# Patient Record
Sex: Female | Born: 1945 | Race: Black or African American | Hispanic: No | Marital: Single | Smoking: Never smoker
Health system: Southern US, Community
[De-identification: ages and names within clinical notes are randomized; demographics above are authoritative.]

## PROBLEM LIST (undated history)

## (undated) DIAGNOSIS — J40 Bronchitis, not specified as acute or chronic: Secondary | ICD-10-CM

## (undated) DIAGNOSIS — E119 Type 2 diabetes mellitus without complications: Secondary | ICD-10-CM

## (undated) DIAGNOSIS — I10 Essential (primary) hypertension: Secondary | ICD-10-CM

## (undated) HISTORY — PX: VESICOVAGINAL FISTULA CLOSURE: SUR270

## (undated) HISTORY — PX: TONSILECTOMY/ADENOIDECTOMY WITH MYRINGOTOMY: SHX6125

## (undated) HISTORY — DX: Type 2 diabetes mellitus without complications: E11.9

## (undated) HISTORY — PX: ABDOMINAL HYSTERECTOMY: SHX81

---

## 2005-01-10 HISTORY — PX: BREAST BIOPSY: SHX20

## 2012-06-16 DIAGNOSIS — M6740 Ganglion, unspecified site: Secondary | ICD-10-CM | POA: Insufficient documentation

## 2012-06-16 DIAGNOSIS — K644 Residual hemorrhoidal skin tags: Secondary | ICD-10-CM | POA: Insufficient documentation

## 2012-06-16 DIAGNOSIS — F4321 Adjustment disorder with depressed mood: Secondary | ICD-10-CM | POA: Insufficient documentation

## 2012-06-16 HISTORY — DX: Adjustment disorder with depressed mood: F43.21

## 2012-06-28 DIAGNOSIS — N63 Unspecified lump in unspecified breast: Secondary | ICD-10-CM | POA: Insufficient documentation

## 2012-07-24 DIAGNOSIS — N644 Mastodynia: Secondary | ICD-10-CM | POA: Insufficient documentation

## 2013-04-06 DIAGNOSIS — J309 Allergic rhinitis, unspecified: Secondary | ICD-10-CM | POA: Insufficient documentation

## 2013-04-06 DIAGNOSIS — E785 Hyperlipidemia, unspecified: Secondary | ICD-10-CM | POA: Insufficient documentation

## 2013-04-06 HISTORY — DX: Hyperlipidemia, unspecified: E78.5

## 2013-09-12 DIAGNOSIS — IMO0002 Reserved for concepts with insufficient information to code with codable children: Secondary | ICD-10-CM | POA: Insufficient documentation

## 2013-09-12 DIAGNOSIS — N816 Rectocele: Secondary | ICD-10-CM | POA: Insufficient documentation

## 2013-09-17 DIAGNOSIS — K219 Gastro-esophageal reflux disease without esophagitis: Secondary | ICD-10-CM | POA: Insufficient documentation

## 2013-09-17 HISTORY — DX: Gastro-esophageal reflux disease without esophagitis: K21.9

## 2016-01-11 HISTORY — PX: LAPAROSCOPIC LEFT COLON RESECTION: SHX1928

## 2016-05-29 ENCOUNTER — Emergency Department (HOSPITAL_BASED_OUTPATIENT_CLINIC_OR_DEPARTMENT_OTHER): Payer: Medicare Other

## 2016-05-29 ENCOUNTER — Emergency Department (HOSPITAL_BASED_OUTPATIENT_CLINIC_OR_DEPARTMENT_OTHER)
Admission: EM | Admit: 2016-05-29 | Discharge: 2016-05-29 | Disposition: A | Discharge status: Home / Self Care | Payer: Medicare Other | Attending: Emergency Medicine | Admitting: Emergency Medicine

## 2016-05-29 ENCOUNTER — Encounter (HOSPITAL_BASED_OUTPATIENT_CLINIC_OR_DEPARTMENT_OTHER): Payer: Self-pay | Admitting: Emergency Medicine

## 2016-05-29 DIAGNOSIS — I10 Essential (primary) hypertension: Secondary | ICD-10-CM | POA: Diagnosis not present

## 2016-05-29 DIAGNOSIS — R05 Cough: Secondary | ICD-10-CM | POA: Insufficient documentation

## 2016-05-29 DIAGNOSIS — R059 Cough, unspecified: Secondary | ICD-10-CM

## 2016-05-29 DIAGNOSIS — R072 Precordial pain: Secondary | ICD-10-CM | POA: Insufficient documentation

## 2016-05-29 HISTORY — DX: Bronchitis, not specified as acute or chronic: J40

## 2016-05-29 HISTORY — DX: Essential (primary) hypertension: I10

## 2016-05-29 LAB — CBC WITH DIFFERENTIAL/PLATELET
BASOS ABS: 0 10*3/uL (ref 0.0–0.1)
BASOS PCT: 0 %
Eosinophils Absolute: 0.1 10*3/uL (ref 0.0–0.7)
Eosinophils Relative: 1 %
HEMATOCRIT: 44.3 % (ref 36.0–46.0)
Hemoglobin: 15.4 g/dL — ABNORMAL HIGH (ref 12.0–15.0)
LYMPHS PCT: 16 %
Lymphs Abs: 1.5 10*3/uL (ref 0.7–4.0)
MCH: 29.5 pg (ref 26.0–34.0)
MCHC: 34.8 g/dL (ref 30.0–36.0)
MCV: 84.9 fL (ref 78.0–100.0)
MONO ABS: 1.4 10*3/uL — AB (ref 0.1–1.0)
Monocytes Relative: 14 %
NEUTROS ABS: 6.8 10*3/uL (ref 1.7–7.7)
Neutrophils Relative %: 69 %
PLATELETS: 256 10*3/uL (ref 150–400)
RBC: 5.22 MIL/uL — AB (ref 3.87–5.11)
RDW: 15 % (ref 11.5–15.5)
WBC: 9.8 10*3/uL (ref 4.0–10.5)

## 2016-05-29 LAB — BASIC METABOLIC PANEL
Anion gap: 8 (ref 5–15)
BUN: 19 mg/dL (ref 6–20)
CHLORIDE: 101 mmol/L (ref 101–111)
CO2: 28 mmol/L (ref 22–32)
Calcium: 10.7 mg/dL — ABNORMAL HIGH (ref 8.9–10.3)
Creatinine, Ser: 0.85 mg/dL (ref 0.44–1.00)
GFR calc non Af Amer: >60 mL/min (ref >60)
Glucose, Bld: 119 mg/dL — ABNORMAL HIGH (ref 65–99)
Potassium: 3.8 mmol/L (ref 3.5–5.1)
Sodium: 137 mmol/L (ref 135–145)

## 2016-05-29 LAB — TROPONIN I
TROPONIN I: 0.03 ng/mL — AB (ref <0.03)
TROPONIN I: 0.03 ng/mL — AB (ref <0.03)

## 2016-05-29 MED ORDER — KETOROLAC TROMETHAMINE 15 MG/ML IJ SOLN
15.0000 mg | Freq: Once | INTRAMUSCULAR | Status: AC
  Administered 2016-05-29: 15 mg via INTRAVENOUS
  Filled 2016-05-29: qty 1

## 2016-05-29 MED ORDER — FLUTICASONE PROPIONATE 50 MCG/ACT NA SUSP: 2.0000 | Freq: Every day | NASAL | 2 refills | Status: AC

## 2016-05-29 MED ORDER — ALBUTEROL SULFATE HFA 108 (90 BASE) MCG/ACT IN AERS
2.0000 | INHALATION_SPRAY | Freq: Once | RESPIRATORY_TRACT | Status: AC
  Administered 2016-05-29: 2 via RESPIRATORY_TRACT
  Filled 2016-05-29: qty 6.7

## 2016-05-29 MED ORDER — BENZONATATE 100 MG PO CAPS: 100.0000 mg | ORAL_CAPSULE | Freq: Three times a day (TID) | ORAL | 0 refills | Status: AC

## 2016-05-29 NOTE — Discharge Instructions (Signed)

## 2016-05-29 NOTE — ED Provider Notes (Signed)
Emergency Department Provider Note   I have reviewed the triage vital signs and the nursing notes.   HISTORY  Chief Complaint Cough   HPI Miranda [name] is a 71 y.o. female with PMH of HTN presents to the emergency department for evaluation of worsening cough and chest tightness with some shortness of breath over the last 3 days. She has had body aches and some subjective fevers. Cough is minimally productive. Does not smoke cigarettes. No abdominal pain, nausea, vomiting, diarrhea. She reports history of seasonal allergy irritation but this feels more severe to her. She describes a central chest tightness that is mostly with coughing. No active chest tights unless with coughing.    Past Medical History:  Diagnosis Date  . Bronchitis   . Hypertension     There are no active problems to display for this patient.   Past Surgical History:  Procedure Laterality Date  . VESICOVAGINAL FISTULA CLOSURE      Current Outpatient Rx  . Order #: [id number] Class: Historical Med  . Order #: [id number] Class: Historical Med  . Order #: [id number] Class: Historical Med  . Order #: [id number] Class: Print  . Order #: [id number] Class: Print    Allergies Patient has no known allergies.  No family history on file.  Social History Social History  Substance Use Topics  . Smoking status: Never Smoker  . Smokeless tobacco: Never Used  . Alcohol use No    Review of Systems  Constitutional: No fever/chills Eyes: No visual changes. ENT: No sore throat. Cardiovascular: Positive chest pain with coughing.  Respiratory: Denies shortness of breath. Positive cough.  Gastrointestinal: No abdominal pain.  No nausea, no vomiting.  No diarrhea.  No constipation. Genitourinary: Negative for dysuria. Musculoskeletal: Negative for back pain. Skin: Negative for rash. Neurological: Negative for headaches, focal weakness or numbness.  10-point ROS otherwise  negative.  ____________________________________________   PHYSICAL EXAM:  VITAL SIGNS: ED Triage Vitals  Enc Vitals Group     BP 05/29/16 0945 140/88     Pulse Rate 05/29/16 0945 67     Resp 05/29/16 0945 18     Temp 05/29/16 0945 99.9 F (37.7 C)     Temp Source 05/29/16 0945 Oral     SpO2 05/29/16 0945 97 %     Weight 05/29/16 0945 157 lb (71.2 kg)     Height 05/29/16 0945 5\' 4"  (1.626 m)     Pain Score 05/29/16 0950 4   Constitutional: Alert and oriented. Well appearing and in no acute distress. Eyes: Conjunctivae are normal. Head: Atraumatic. Nose: No congestion/rhinnorhea. Mouth/Throat: Mucous membranes are moist.  Oropharynx non-erythematous. Neck: No stridor.  Cardiovascular: Normal rate, regular rhythm. Good peripheral circulation. Grossly normal heart sounds.   Respiratory: Normal respiratory effort.  No retractions. Lungs CTAB. Gastrointestinal: Soft and nontender. No distention.  Musculoskeletal: No lower extremity tenderness nor edema. No gross deformities of extremities. Neurologic:  Normal speech and language. No gross focal neurologic deficits are appreciated.  Skin:  Skin is warm, dry and intact. No rash noted.   ____________________________________________   LABS (all labs ordered are listed, but only abnormal results are displayed)  Labs Reviewed  TROPONIN I - Abnormal; Notable for the following:       Result Value   Troponin I 0.03 (*)    All other components within normal limits  CBC WITH DIFFERENTIAL/PLATELET - Abnormal; Notable for the following:    RBC 5.22 (*)    Hemoglobin 15.4 (*)    Monocytes Absolute 1.4 (*)  All other components within normal limits  BASIC METABOLIC PANEL - Abnormal; Notable for the following:    Glucose, Bld 119 (*)    Calcium 10.7 (*)    All other components within normal limits  TROPONIN I - Abnormal; Notable for the following:    Troponin I 0.03 (*)    All other components within normal limits    ____________________________________________  EKG   EKG Interpretation  Date/Time:  Sunday May 29 2016 10:38:17 EDT Ventricular Rate:  78 PR Interval:    QRS Duration: 83 QT Interval:  488 QTC Calculation: 556 R Axis:   13 Text Interpretation:  Sinus rhythm Borderline T wave abnormalities Prolonged QT interval No STEMI. No old tracing for comparison.  Confirmed by Nanda Quinton [id number]) on 05/30/2016 9:52:38 AM       ____________________________________________  RADIOLOGY  Dg Chest 2 View  Result Date: 05/29/2016 CLINICAL DATA:  Cough, congestion, yellow phlegm, symptoms x 3 days, unsure of fever Takes albulterol as needed for seasonal allergies EXAM: CHEST - 2 VIEW COMPARISON:  none FINDINGS: Minimal subsegmental atelectasis, infiltrate or scarring in both lung bases. Lungs otherwise clear. Heart size and mediastinal contours are within normal limits. No pneumothorax or effusion. Visualized bones unremarkable. IMPRESSION: Minimal bibasilar subsegmental atelectasis, infiltrate or scarring. Electronically Signed   By: Lucrezia Europe M.D.   On: 05/29/2016 10:22    ____________________________________________   PROCEDURES  Procedure(s) performed:   Procedures  None ____________________________________________   INITIAL IMPRESSION / ASSESSMENT AND PLAN / ED COURSE  Pertinent labs & imaging results that were available during my care of the patient were reviewed by me and considered in my medical decision making (see chart for details).  Patient resents to the emergency department for evaluation of difficulty breathing, cough, and chest tightness. Symptoms seem most consistent with a viral process. Plan for chest x-ray to rule out pneumonia. Will obtain labs for ACS r/o. With 3 days of symptoms there is no need for trending.   Troponin is 0.03 which is top end of normal for test. Patient feeling better after IVF and low-dose Toradol. Plan for repeat troponin to ensure no rising  value. Explained test result to patient and husband along with my plan. Agreeable to stay.   Repeat troponin unchanged. Plan for discharge with cough medication and plan for conservative mgmt at home and PCP f/u. Discussed return precautions for persistent chest pressure without cough or symptoms of other anginal equivalents.   At this time, I do not feel there is any life-threatening condition present. I have reviewed and discussed all results (EKG, imaging, lab, urine as appropriate), exam findings with patient. I have reviewed nursing notes and appropriate previous records.  I feel the patient is safe to be discharged home without further emergent workup. Discussed usual and customary return precautions. Patient and family (if present) verbalize understanding and are comfortable with this plan.  Patient will follow-up with their primary care provider. If they do not have a primary care provider, information for follow-up has been provided to them. All questions have been answered.  ____________________________________________  FINAL CLINICAL IMPRESSION(S) / ED DIAGNOSES  Final diagnoses:  Cough  Precordial chest pain     MEDICATIONS GIVEN DURING THIS VISIT:  Medications  albuterol (PROVENTIL HFA;VENTOLIN HFA) 108 (90 Base) MCG/ACT inhaler 2 puff (2 puffs Inhalation Given 05/29/16 1033)  ketorolac (TORADOL) 15 MG/ML injection 15 mg (15 mg Intravenous Given 05/29/16 1339)     NEW OUTPATIENT MEDICATIONS STARTED DURING THIS VISIT:  Discharge Medication List as of 05/29/2016  2:34 PM    START taking these medications   Details  benzonatate (TESSALON) 100 MG capsule Take 1 capsule (100 mg total) by mouth every 8 (eight) hours., Starting Sun 05/29/2016, Print    fluticasone (FLONASE) 50 MCG/ACT nasal spray Place 2 sprays into both nostrils daily., Starting Sun 05/29/2016, Print        Note:  This document was prepared using Dragon voice recognition software and may include unintentional  dictation errors.  Nanda Quinton, MD Emergency Medicine   Daveda Larock, [facility], MD 05/30/16 [phone]

## 2016-05-29 NOTE — ED Notes (Signed)
Pt ambulatory to BR without assistance, in NAD.

## 2016-05-29 NOTE — ED Notes (Signed)
Given juice

## 2016-05-29 NOTE — ED Triage Notes (Signed)
Since Thursday has been aching all over, cough, yellow mucus and nasal congestion. Was exposed to a family member that was coughing

## 2016-05-29 NOTE — ED Notes (Signed)
Date and time results received: 05/29/16  1155 (use smartphrase ".now" to insert current time)  Test: troponin Critical Value: 0.03  Name of Provider Notified: Sophie, RN  Orders Received? Or Actions Taken?: No orders received

## 2016-06-21 DIAGNOSIS — H35372 Puckering of macula, left eye: Secondary | ICD-10-CM | POA: Insufficient documentation

## 2016-06-21 DIAGNOSIS — H04123 Dry eye syndrome of bilateral lacrimal glands: Secondary | ICD-10-CM | POA: Insufficient documentation

## 2016-06-21 DIAGNOSIS — H2513 Age-related nuclear cataract, bilateral: Secondary | ICD-10-CM | POA: Insufficient documentation

## 2016-10-16 DIAGNOSIS — C184 Malignant neoplasm of transverse colon: Secondary | ICD-10-CM | POA: Insufficient documentation

## 2016-10-16 HISTORY — DX: Malignant neoplasm of transverse colon: C18.4

## 2016-11-24 DIAGNOSIS — I10 Essential (primary) hypertension: Secondary | ICD-10-CM

## 2016-11-24 HISTORY — DX: Essential (primary) hypertension: I10

## 2017-01-12 DIAGNOSIS — H5213 Myopia, bilateral: Secondary | ICD-10-CM | POA: Insufficient documentation

## 2017-01-12 DIAGNOSIS — H43813 Vitreous degeneration, bilateral: Secondary | ICD-10-CM | POA: Insufficient documentation

## 2018-12-10 DIAGNOSIS — U071 COVID-19: Secondary | ICD-10-CM | POA: Insufficient documentation

## 2019-04-02 DIAGNOSIS — E559 Vitamin D deficiency, unspecified: Secondary | ICD-10-CM | POA: Insufficient documentation

## 2019-04-02 DIAGNOSIS — E213 Hyperparathyroidism, unspecified: Secondary | ICD-10-CM | POA: Insufficient documentation

## 2019-08-29 ENCOUNTER — Encounter: Payer: Self-pay | Admitting: Surgery

## 2019-08-29 ENCOUNTER — Emergency Department (HOSPITAL_BASED_OUTPATIENT_CLINIC_OR_DEPARTMENT_OTHER): Payer: Medicare Other

## 2019-08-29 ENCOUNTER — Ambulatory Visit: Payer: Self-pay | Admitting: Surgery

## 2019-08-29 ENCOUNTER — Emergency Department (HOSPITAL_BASED_OUTPATIENT_CLINIC_OR_DEPARTMENT_OTHER)
Admission: EM | Admit: 2019-08-29 | Discharge: 2019-08-29 | Disposition: A | Discharge status: Home / Self Care | Payer: Medicare Other | Attending: Emergency Medicine | Admitting: Emergency Medicine

## 2019-08-29 ENCOUNTER — Other Ambulatory Visit: Payer: Self-pay

## 2019-08-29 ENCOUNTER — Encounter (HOSPITAL_BASED_OUTPATIENT_CLINIC_OR_DEPARTMENT_OTHER): Payer: Self-pay

## 2019-08-29 DIAGNOSIS — Z20822 Contact with and (suspected) exposure to covid-19: Secondary | ICD-10-CM | POA: Diagnosis not present

## 2019-08-29 DIAGNOSIS — K8689 Other specified diseases of pancreas: Secondary | ICD-10-CM | POA: Diagnosis not present

## 2019-08-29 DIAGNOSIS — Z79899 Other long term (current) drug therapy: Secondary | ICD-10-CM | POA: Diagnosis not present

## 2019-08-29 DIAGNOSIS — Z7982 Long term (current) use of aspirin: Secondary | ICD-10-CM | POA: Insufficient documentation

## 2019-08-29 DIAGNOSIS — U071 COVID-19: Secondary | ICD-10-CM

## 2019-08-29 DIAGNOSIS — K430 Incisional hernia with obstruction, without gangrene: Secondary | ICD-10-CM | POA: Insufficient documentation

## 2019-08-29 DIAGNOSIS — I10 Essential (primary) hypertension: Secondary | ICD-10-CM | POA: Diagnosis not present

## 2019-08-29 DIAGNOSIS — K56609 Unspecified intestinal obstruction, unspecified as to partial versus complete obstruction: Secondary | ICD-10-CM | POA: Insufficient documentation

## 2019-08-29 DIAGNOSIS — R1084 Generalized abdominal pain: Secondary | ICD-10-CM | POA: Diagnosis present

## 2019-08-29 DIAGNOSIS — E119 Type 2 diabetes mellitus without complications: Secondary | ICD-10-CM | POA: Insufficient documentation

## 2019-08-29 LAB — COMPREHENSIVE METABOLIC PANEL
ALT: 23 U/L (ref 0–44)
AST: 21 U/L (ref 15–41)
Albumin: 4.2 g/dL (ref 3.5–5.0)
Alkaline Phosphatase: 87 U/L (ref 38–126)
Anion gap: 12 (ref 5–15)
BUN: 17 mg/dL (ref 8–23)
CO2: 21 mmol/L — ABNORMAL LOW (ref 22–32)
Calcium: 10.7 mg/dL — ABNORMAL HIGH (ref 8.9–10.3)
Chloride: 104 mmol/L (ref 98–111)
Creatinine, Ser: 0.87 mg/dL (ref 0.44–1.00)
GFR calc Af Amer: >60 mL/min (ref >60)
GFR calc non Af Amer: >60 mL/min (ref >60)
Glucose, Bld: 147 mg/dL — ABNORMAL HIGH (ref 70–99)
Potassium: 3.9 mmol/L (ref 3.5–5.1)
Sodium: 137 mmol/L (ref 135–145)
Total Bilirubin: 0.7 mg/dL (ref 0.3–1.2)
Total Protein: 9 g/dL — ABNORMAL HIGH (ref 6.5–8.1)

## 2019-08-29 LAB — CBC WITH DIFFERENTIAL/PLATELET
Abs Immature Granulocytes: 0.04 10*3/uL (ref 0.00–0.07)
Basophils Absolute: 0 10*3/uL (ref 0.0–0.1)
Basophils Relative: 0 %
Eosinophils Absolute: 0 10*3/uL (ref 0.0–0.5)
Eosinophils Relative: 0 %
HCT: 50 % — ABNORMAL HIGH (ref 36.0–46.0)
Hemoglobin: 16.8 g/dL — ABNORMAL HIGH (ref 12.0–15.0)
Immature Granulocytes: 0 %
Lymphocytes Relative: 11 %
Lymphs Abs: 1.1 10*3/uL (ref 0.7–4.0)
MCH: 28.6 pg (ref 26.0–34.0)
MCHC: 33.6 g/dL (ref 30.0–36.0)
MCV: 85.2 fL (ref 80.0–100.0)
Monocytes Absolute: 0.4 10*3/uL (ref 0.1–1.0)
Monocytes Relative: 4 %
Neutro Abs: 8.3 10*3/uL — ABNORMAL HIGH (ref 1.7–7.7)
Neutrophils Relative %: 85 %
Platelets: 300 10*3/uL (ref 150–400)
RBC: 5.87 MIL/uL — ABNORMAL HIGH (ref 3.87–5.11)
RDW: 14.5 % (ref 11.5–15.5)
WBC: 9.9 10*3/uL (ref 4.0–10.5)
nRBC: 0 % (ref 0.0–0.2)

## 2019-08-29 LAB — URINALYSIS, ROUTINE W REFLEX MICROSCOPIC
Bilirubin Urine: NEGATIVE
Glucose, UA: 250 mg/dL — AB
Hgb urine dipstick: NEGATIVE
Ketones, ur: NEGATIVE mg/dL
Leukocytes,Ua: NEGATIVE
Nitrite: NEGATIVE
Protein, ur: 100 mg/dL — AB
Specific Gravity, Urine: 1.025 (ref 1.005–1.030)
pH: 6 (ref 5.0–8.0)

## 2019-08-29 LAB — SARS CORONAVIRUS 2 BY RT PCR (HOSPITAL ORDER, PERFORMED IN ~~LOC~~ HOSPITAL LAB): SARS Coronavirus 2: NEGATIVE

## 2019-08-29 LAB — URINALYSIS, MICROSCOPIC (REFLEX)

## 2019-08-29 LAB — LIPASE, BLOOD: Lipase: 37 U/L (ref 11–51)

## 2019-08-29 MED ORDER — IOHEXOL 300 MG/ML  SOLN
100.0000 mL | Freq: Once | INTRAMUSCULAR | Status: AC | PRN
  Administered 2019-08-29: 100 mL via INTRAVENOUS

## 2019-08-29 MED ORDER — FENTANYL CITRATE (PF) 100 MCG/2ML IJ SOLN
50.0000 ug | Freq: Once | INTRAMUSCULAR | Status: AC | PRN
  Administered 2019-08-29: 50 ug via INTRAVENOUS
  Filled 2019-08-29: qty 2

## 2019-08-29 MED ORDER — FENTANYL CITRATE (PF) 100 MCG/2ML IJ SOLN
50.0000 ug | Freq: Once | INTRAMUSCULAR | Status: AC
  Administered 2019-08-29: 50 ug via INTRAVENOUS
  Filled 2019-08-29: qty 2

## 2019-08-29 MED ORDER — ONDANSETRON HCL 4 MG/2ML IJ SOLN
4.0000 mg | Freq: Once | INTRAMUSCULAR | Status: DC
  Filled 2019-08-29: qty 2

## 2019-08-29 MED ORDER — MORPHINE SULFATE (PF) 2 MG/ML IV SOLN
2.0000 mg | Freq: Once | INTRAVENOUS | Status: DC
  Filled 2019-08-29: qty 1

## 2019-08-29 MED ORDER — ONDANSETRON HCL 4 MG/2ML IJ SOLN
4.0000 mg | Freq: Once | INTRAMUSCULAR | Status: AC | PRN
  Filled 2019-08-29: qty 2

## 2019-08-29 MED ORDER — ONDANSETRON HCL 4 MG/2ML IJ SOLN
4.0000 mg | Freq: Once | INTRAMUSCULAR | Status: AC
  Administered 2019-08-29: 4 mg via INTRAVENOUS

## 2019-08-29 NOTE — ED Provider Notes (Signed)
[facility] EMERGENCY DEPARTMENT Provider Note   CSN: [id number] Arrival date & time: 08/29/19  0935     History Chief Complaint  Patient presents with  . Abdominal Pain    Miranda [name] is a 74 y.o. female   The history is provided by the patient.  Abdominal Pain Pain location:  Generalized Pain quality: aching and cramping   Pain quality: not bloating   Pain radiates to:  Does not radiate Pain severity:  Moderate Onset quality:  Sudden Duration:  24 hours Timing:  Constant Progression:  Waxing and waning Chronicity:  New Context: previous surgery   Context: not alcohol use, not awakening from sleep, not diet changes, not eating, not laxative use, not medication withdrawal, not recent illness, not recent sexual activity, not recent travel, not retching, not sick contacts, not suspicious food intake and not trauma   Relieved by:  Nothing Worsened by:  Nothing Ineffective treatments:  OTC medications (pepto bismol) Associated symptoms: anorexia, flatus and vomiting   Associated symptoms: no belching, no chest pain, no chills, no constipation, no cough, no diarrhea, no dysuria, no fatigue, no fever, no hematemesis, no hematochezia, no hematuria, no melena, no nausea, no shortness of breath, no sore throat, no vaginal bleeding and no vaginal discharge   Risk factors: being elderly        Past Medical History:  Diagnosis Date  . Bronchitis   . Hypertension     There are no problems to display for this patient.   Past Surgical History:  Procedure Laterality Date  . VESICOVAGINAL FISTULA CLOSURE       OB History   No obstetric history on file.     No family history on file.  Social History   Tobacco Use  . Smoking status: Never Smoker  . Smokeless tobacco: Never Used  Substance Use Topics  . Alcohol use: No  . Drug use: No    Home Medications Prior to Admission medications   Medication Sig Start Date End Date Taking? Authorizing Provider    albuterol (PROVENTIL HFA;VENTOLIN HFA) 108 (90 Base) MCG/ACT inhaler Inhale 2 puffs into the lungs every 4 (four) hours as needed for wheezing or shortness of breath.    [provider]  amLODipine (NORVASC) 10 MG tablet Take 10 mg by mouth daily.    [provider]  aspirin EC 81 MG tablet Take 81 mg by mouth daily.    [provider]  benzonatate (TESSALON) 100 MG capsule Take 1 capsule (100 mg total) by mouth every 8 (eight) hours. 05/29/16   Long, [facility], MD  fluticasone (FLONASE) 50 MCG/ACT nasal spray Place 2 sprays into both nostrils daily. 05/29/16   Long, [facility], MD    Allergies    Sulfa antibiotics  Review of Systems   Review of Systems  Constitutional: Negative for chills, fatigue and fever.  HENT: Negative for sore throat.   Respiratory: Negative for cough and shortness of breath.   Cardiovascular: Negative for chest pain.  Gastrointestinal: Positive for abdominal pain, anorexia, flatus and vomiting. Negative for constipation, diarrhea, hematemesis, hematochezia, melena and nausea.  Genitourinary: Negative for dysuria, hematuria, vaginal bleeding and vaginal discharge.  All other systems reviewed and are negative.   Physical Exam Updated Vital Signs BP (!) 158/106 (BP Location: Left Arm)   Pulse 77   Temp 98.5 F (36.9 C) (Oral)   Resp 14   Ht 5\' 3"  (1.6 m)   Wt 68 kg   SpO2 100%  BMI 26.57 kg/m   Physical Exam Vitals and nursing note reviewed.  Constitutional:      General: She is not in acute distress.    Appearance: She is well-developed. She is not diaphoretic.  HENT:     Head: Normocephalic and atraumatic.  Eyes:     General: No scleral icterus.    Conjunctiva/sclera: Conjunctivae normal.  Cardiovascular:     Rate and Rhythm: Normal rate and regular rhythm.     Heart sounds: Normal heart sounds. No murmur heard.  No friction rub. No gallop.   Pulmonary:     Effort: Pulmonary effort is normal. No respiratory distress.      Breath sounds: Normal breath sounds.  Abdominal:     General: Bowel sounds are normal. There is no distension.     Palpations: Abdomen is soft. There is no mass.     Tenderness: There is abdominal tenderness. There is guarding.  Musculoskeletal:     Cervical back: Normal range of motion.  Skin:    General: Skin is warm and dry.  Neurological:     Mental Status: She is alert and oriented to person, place, and time.  Psychiatric:        Behavior: Behavior normal.     ED Results / Procedures / Treatments   Labs (all labs ordered are listed, but only abnormal results are displayed) Labs Reviewed  CBC WITH DIFFERENTIAL/PLATELET  COMPREHENSIVE METABOLIC PANEL  LIPASE, BLOOD  URINALYSIS, ROUTINE W REFLEX MICROSCOPIC    EKG None  Radiology No results found.  Procedures Hernia reduction  Date/Time: 08/29/2019 2:42 PM Performed by: Margarita Mail, PA-C Authorized by: Margarita Mail, PA-C  Consent: Verbal consent obtained. Consent given by: patient Patient understanding: patient states understanding of the procedure being performed Patient identity confirmed: verbally with patient Preparation: Patient was prepped and draped in the usual sterile fashion. Local anesthesia used: no  Anesthesia: Local anesthesia used: no  Sedation: Patient sedated: no  Comments: Ice applied for 20 min. Patient place in Trendelenburg. Tolerated with difficulty due to pain. Unsuccessful reduction procedure    (including critical care time)  Medications Ordered in ED Medications  fentaNYL (SUBLIMAZE) injection 50 mcg (has no administration in time range)    Or  ondansetron (ZOFRAN) injection 4 mg (has no administration in time range)    ED Course  I have reviewed the triage vital signs and the nursing notes.  Pertinent labs & imaging results that were available during my care of the patient were reviewed by me and considered in my medical decision making (see chart for  details).    MDM Rules/Calculators/A&P                         CC: Abdominal pain and vomiting VS:  Vitals:   08/29/19 0943 08/29/19 1100 08/29/19 1239 08/29/19 1400  BP:  (!) 151/92 (!) 143/97 (!) 143/93  Pulse:  62 72 69  Resp:   16 18  Temp:      TempSrc:      SpO2:  96% 99% 98%  Weight: 68 kg     Height: 5\' 3"  (1.6 m)       EY:CXKGYJE is gathered by patient and EMR. Previous records obtained and reviewed. DDX:The patient's complaint of abdominal pain vomiting involves an extensive number of diagnostic and treatment options, and is a complaint that carries with it a high risk of complications, morbidity, and potential mortality. Given the large differential diagnosis, medical  decision making is of high complexity. The emergent differential diagnosis for vomiting includes, but is not limited to ACS/MI, Boerhaave's, DKA, Intracranial Hemorrhage, Ischemic bowel, Meningitis, Sepsis, Acute radiation syndrome, Acute gastric dilation, Acetaminophen toxicity, Adrenal insufficiency, Appendicitis, Aspirin toxicity, Bowel obstruction/ileus, Carbon monoxide poisoning, Cholecystitis, CNS tumor. Digoxin toxicity, Electrolyte abnormalities, Elevated ICP, Gastric outlet obstruction, Hyperemesis gravidarum, Pancreatitis, Peritonitis, Ruptured viscus, Testicular torsion/ovarian torsion, Theophyline toxicity, Biliary colic, Cannabinoid hyperemesis syndrome, Chemotherapy, Disulfiram effect, Erythromycin, ETOH, Gastritis, Gastroenteritis, Gastroparesis, Hepatitis, Ibuprofen, Ipecac toxicity, Labyrinthitis, Migraine, Motion sickness, Narcotic withdrawal, Thyroid, Pregnancy, Peptic ulcer disease, Renal colic, and UTI  Labs: I ordered reviewed and interpreted labs which include CBC without elevated white blood cell count.  She has polycythemia of unknown origin.  CMP with mildly elevated blood glucose of insignificant value.  Lipase within normal limits, Covid test negative, urine without evidence of  infection. Imaging: I ordered and reviewed images which included CT abdomen pelvis with contrast. I independently visualized and interpreted all imaging. 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  EKG: Consults: Case discussed with PA Maczis/ Dr. Johney Maine Will need ED-ED transfer and surgical evaluation. Case discussed with Dr. Zenia Resides who accepts the patient TO [facility].  **EDP will need to consult surgery upon arrival to let them know the patient has arrived.**  MDM: Patient here with complaint of abdominal pain nausea and vomiting.  Upon initial assessment due to body habitus I did not see or feel a ventral hernia however after CT scan I was able to see him minimally enlarged area just to the right of the patient's umbilicus and a large ventral surgical scar.  This area is point/exquisitely tender to palpation.  I did attempt to reduce the area however the hernia is large, soft and difficult to evaluate due to body habitus.  I am unsure if I was able to appropriately reduce it.  Due to this, her intolerance of foods, vomiting patient will need surgical evaluation.  She has no nausea at this time and only vomits after ingesting foods or liquids.  I discussed all findings with the patient including pancreatic and renal mass.  She states she was unaware of the renal issue however she said that her cancer doctor at [facility] has previously evaluated the pancreatic mass.  I discussed that she will need to follow-up with him regarding the CT scan today which showed some increase in its size.  She understands and will follow up in the outpatient setting. Patient disposition: Transfer The patient appears reasonably stabilized for transfer considering the current resources, flow, and capabilities available in the ED at this time, and  I doubt any other [facility] requiring further screening and/or treatment in the ED prior to transfer.         Final Clinical Impression(s) / ED Diagnoses Final diagnoses:  None    Rx / DC Orders ED Discharge Orders    None       Margarita Mail, PA-C 08/29/19 1451    Fredia Sorrow, MD 09/02/19 [phone]

## 2019-08-29 NOTE — Consult Note (Signed)
Reason for Consult:Incarcerated incisional hernia Referring Physician: Janani [name] is an 74 y.o. female.  HPI: This is a 74 year old female s/p left colon resection in 2018 for colon cancer.  She presented to [facility] earlier today with periumbilical pain, nausea, and vomiting.  CT scan revealed a small periumbilical hernia containing a segment of small bowel.  Attempts were made to reduce this.  She remained fairly uncomfortable so she was transferred to [facility] for further evaluation.  She has not had any further vomiting since transfer.  Her surgery was performed in 2018 by Dr. Lenise Arena at [facility].  All of her previous care has been at [facility]  Past Medical History:  Diagnosis Date  . Adjustment disorder with depressed mood 06/16/2012  . Bronchitis   . Diabetes mellitus ([place])   . Essential hypertension 11/24/2016  . GERD (gastroesophageal reflux disease) 09/17/2013  . Hyperlipidemia 04/06/2013  . 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 ABDOMINAL HYSTERECTOMY    . BREAST BIOPSY Right 2007  . LAPAROSCOPIC LEFT COLON RESECTION  2018   [facility] Dr Morton Stall.    . TONSILECTOMY/ADENOIDECTOMY WITH MYRINGOTOMY    . VESICOVAGINAL FISTULA CLOSURE      No family history on file.  Social History:  reports that she has never smoked. She has never used smokeless tobacco. She reports that she does not drink alcohol and does not use drugs.  Allergies:  Allergies  Allergen Reactions  . Sulfa Antibiotics Nausea And Vomiting and Other (See Comments)    Other reaction(s): ITCHING Diarrhea Other reaction(s): ITCHING Diarrhea     Medications:  Prior to Admission medications   Medication Sig Start Date End Date Taking? Authorizing Provider  albuterol (PROVENTIL HFA;VENTOLIN HFA) 108 (90 Base) MCG/ACT inhaler Inhale 2 puffs into the lungs every 4 (four) hours as  needed for wheezing or shortness of breath.    [provider]  amLODipine (NORVASC) 10 MG tablet Take 10 mg by mouth daily.    [provider]  aspirin EC 81 MG tablet Take 81 mg by mouth daily.    [provider]  benzonatate (TESSALON) 100 MG capsule Take 1 capsule (100 mg total) by mouth every 8 (eight) hours. 05/29/16   Long, Wonda Olds, MD  fluticasone (FLONASE) 50 MCG/ACT nasal spray Place 2 sprays into both nostrils daily. 05/29/16   Long, Wonda Olds, MD     Results for orders placed or performed during the hospital encounter of 08/29/19 (from the past 48 hour(s))  Urinalysis, Routine w reflex microscopic Urine, Clean Catch     Status: Abnormal   Collection Time: 08/29/19  9:48 AM  Result Value Ref Range   Color, Urine YELLOW YELLOW   APPearance CLEAR CLEAR   Specific Gravity, Urine 1.025 1.005 - 1.030   pH 6.0 5.0 - 8.0   Glucose, UA 250 (A) NEGATIVE mg/dL   Hgb urine dipstick NEGATIVE NEGATIVE   Bilirubin Urine NEGATIVE NEGATIVE   Ketones, ur NEGATIVE NEGATIVE mg/dL   Protein, ur 100 (A) NEGATIVE mg/dL   Nitrite NEGATIVE NEGATIVE   Leukocytes,Ua NEGATIVE NEGATIVE    Comment: Performed at [facility], [address] [place], [place] [address]  Urinalysis, Microscopic (reflex)     Status: Abnormal   Collection Time: 08/29/19  9:48 AM  Result Value Ref Range   RBC / HPF 0-5 0 -  5 RBC/hpf   WBC, UA 0-5 0 - 5 WBC/hpf   Bacteria, UA FEW (A) NONE SEEN   Squamous Epithelial / LPF 6-10 0 - 5    Comment: Performed at [facility], [address] [place], [place] [address]  CBC with Differential     Status: Abnormal   Collection Time: 08/29/19 10:33 AM  Result Value Ref Range   WBC 9.9 4.0 - 10.5 K/uL   RBC 5.87 (H) 3.87 - 5.11 MIL/uL   Hemoglobin 16.8 (H) 12.0 - 15.0 g/dL   HCT 50.0 (H) 36 - 46 %   MCV 85.2 80.0 - 100.0 fL   MCH 28.6 26.0 - 34.0 pg   MCHC 33.6 30.0 - 36.0 g/dL   RDW 14.5 11.5 - 15.5 %   Platelets 300 150 - 400  K/uL   nRBC 0.0 0.0 - 0.2 %   Neutrophils Relative % 85 %   Neutro Abs 8.3 (H) 1.7 - 7.7 K/uL   Lymphocytes Relative 11 %   Lymphs Abs 1.1 0.7 - 4.0 K/uL   Monocytes Relative 4 %   Monocytes Absolute 0.4 0 - 1 K/uL   Eosinophils Relative 0 %   Eosinophils Absolute 0.0 0 - 0 K/uL   Basophils Relative 0 %   Basophils Absolute 0.0 0 - 0 K/uL   Immature Granulocytes 0 %   Abs Immature Granulocytes 0.04 0.00 - 0.07 K/uL    Comment: Performed at [facility], [address] [place], [place] [address]  Comprehensive metabolic panel     Status: Abnormal   Collection Time: 08/29/19 10:33 AM  Result Value Ref Range   Sodium 137 135 - 145 mmol/L   Potassium 3.9 3.5 - 5.1 mmol/L   Chloride 104 98 - 111 mmol/L   CO2 21 (L) 22 - 32 mmol/L   Glucose, Bld 147 (H) 70 - 99 mg/dL    Comment: Glucose reference range applies only to samples taken after fasting for at least 8 hours.   BUN 17 8 - 23 mg/dL   Creatinine, Ser 0.87 0.44 - 1.00 mg/dL   Calcium 10.7 (H) 8.9 - 10.3 mg/dL   Total Protein 9.0 (H) 6.5 - 8.1 g/dL   Albumin 4.2 3.5 - 5.0 g/dL   AST 21 15 - 41 U/L   ALT 23 0 - 44 U/L   Alkaline Phosphatase 87 38 - 126 U/L   Total Bilirubin 0.7 0.3 - 1.2 mg/dL   GFR calc non Af Amer >60 >60 mL/min   GFR calc Af Amer >60 >60 mL/min   Anion gap 12 5 - 15    Comment: Performed at [facility], [address] [place], [place] [address]  Lipase, blood     Status: None   Collection Time: 08/29/19 10:33 AM  Result Value Ref Range   Lipase 37 11 - 51 U/L    Comment: Performed at [facility], [address] [place], [place] [address]  SARS Coronavirus 2 by RT PCR (hospital order, performed in [facility] hospital lab) Nasopharyngeal Nasopharyngeal Swab     Status: None   Collection Time: 08/29/19 12:41 PM   Specimen: Nasopharyngeal Swab  Result Value Ref Range   SARS Coronavirus 2 NEGATIVE NEGATIVE    Comment: (NOTE) SARS-CoV-2 target nucleic acids are NOT  DETECTED.  The SARS-CoV-2 RNA is generally detectable in upper and lower respiratory specimens during the acute phase of infection. The lowest  concentration of SARS-CoV-2 viral copies this assay can detect is 250 copies / mL. A negative result does not preclude SARS-CoV-2 infection and should not be used as the sole basis for treatment or other patient management decisions.  A negative result may occur with improper specimen collection / handling, submission of specimen other than nasopharyngeal swab, presence of viral mutation(s) within the areas targeted by this assay, and inadequate number of viral copies (<250 copies / mL). A negative result must be combined with clinical observations, patient history, and epidemiological information.  Fact Sheet for Patients:   StrictlyIdeas.no  Fact Sheet for Healthcare Providers: BankingDealers.co.za  This test is not yet approved or  cleared by the [place] FDA and has been authorized for detection and/or diagnosis of SARS-CoV-2 by FDA under an Emergency Use Authorization (EUA).  This EUA will remain in effect (meaning this test can be used) for the duration of the COVID-19 declaration under Section 564(b)(1) of the Act, 21 U.S.C. section 360bbb-3(b)(1), unless the authorization is terminated or revoked sooner.  Performed at [facility], [address] [place], [place] [address]     CT Abdomen Pelvis W Contrast  Result Date: 08/29/2019 CLINICAL DATA:  Nausea and vomiting. Abdominal pain and bloating. Symptoms began last night. EXAM: CT ABDOMEN AND PELVIS WITH CONTRAST TECHNIQUE: Multidetector CT imaging of the abdomen and pelvis was performed using the standard protocol following bolus administration of intravenous contrast. CONTRAST:  168mL OMNIPAQUE IOHEXOL 300 MG/ML  SOLN COMPARISON:  Outside CT 11/24/2016. FINDINGS: Lower chest: Mild patchy density at the lung bases right  more than left could be scarring or mild atelectasis. No pleural effusion. No pericardial fluid. Hepatobiliary: Mild fatty change of the liver. No focal lesion. Gallstone in the gallbladder dependently measuring 2.9 cm in diameter. No CT evidence of cholecystitis. Pancreas: 2.8 cm low-density mass in the head of the pancreas. Compared to a study of 2018 when this area measured about 2 cm in size, in a person of this age this remains suggestive of lower grade pancreatic tumor. No sign of acute pancreatitis. Spleen: Normal Adrenals/Urinary Tract: Adrenal glands are normal. Bilateral renal cysts. In the midportion of the right kidney, the cyst has enlarged in shows internal complexity, measuring 2.5 cm. No hydronephrosis. Bladder is normal. Stomach/Bowel: Newly seen ventral hernia, periumbilical, containing a loop of small intestine with relative obstruction in this location. No other significant bowel finding. Diverticulosis without evidence of diverticulitis. Vascular/Lymphatic: Aortic atherosclerosis, mild. No aneurysm. IVC is normal. No retroperitoneal adenopathy. Reproductive: Previous hysterectomy.  No pelvic mass. Other: Small amount of free fluid in the pelvis.  No free air. Musculoskeletal: Ordinary lower lumbar degenerative changes. IMPRESSION: Periumbilical ventral hernia containing a loop of small intestine with partial small bowel obstruction in this location. Chololithiasis.  Mild fatty change the liver. Mass at the head of the pancreas measuring up to 2.8 cm, enlarged from measurement of 2 cm in 2018. 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 Because of interval enlargement, abdominal MRI with and without contrast is recommended. Enlargement of a complex cystic lesion of the midportion of the right kidney. This could also be evaluated with MRI with and without contrast. Electronically Signed   By: Nelson Chimes M.D.   On: 08/29/2019 12:01    Review of Systems  HENT: Negative for ear  discharge, ear pain, hearing loss and tinnitus.   Eyes: Negative for photophobia and pain.  Respiratory: Negative for cough and shortness of breath.   Cardiovascular:  Negative for chest pain.  Gastrointestinal: Positive for abdominal pain, nausea and vomiting.  Genitourinary: Negative for dysuria, flank pain, frequency and urgency.  Musculoskeletal: Negative for back pain, myalgias and neck pain.  Neurological: Negative for dizziness and headaches.  Hematological: Does not bruise/bleed easily.  Psychiatric/Behavioral: The patient is not nervous/anxious.    Blood pressure 133/87, pulse 61, temperature (!) 97.5 F (36.4 C), temperature source Oral, resp. rate 16, height 5\' 3"  (1.6 m), weight 68 kg, SpO2 99 %. Physical Exam Constitutional:  WDWN in NAD, conversant, no obvious deformities; lying in bed comfortably Eyes:  Pupils equal, round; sclera anicteric; moist conjunctiva; no lid lag HENT:  Oral mucosa moist; good dentition  Neck:  No masses palpated, trachea midline; no thyromegaly Lungs:  CTA bilaterally; normal respiratory effort CV:  Regular rate and rhythm; no murmurs; extremities well-perfused with no edema Abd:  +bowel sounds, soft, non-tender, no palpable organomegaly; long midline incision; palpable hernia just to the right of umbilicus.  With the patient relaxed and supine, I was able to reduce the hernia with immediate relief of her discomfort. Musc:  Unable to assess gait; no apparent clubbing or cyanosis in extremities Lymphatic:  No palpable cervical or axillary lymphadenopathy Skin:  Warm, dry; no sign of jaundice Psychiatric - alert and oriented x 4; calm mood and affect  The patient got up and ambulated to the restroom.  I reexamined her after she got back in bed and there is no sign of recurrent herniation.  Assessment/Plan: Incisional hernia - now reduced  Patient may be discharged home.  She should follow-up with her previous surgeon to discuss hernia repair.  If  she is unable to see surgeons in that network, we would be glad to see her at [facility] here in [place].  [phone]  Miranda [name] 08/29/2019, 6:50 PM

## 2019-08-29 NOTE — ED Provider Notes (Signed)
4:41 PM patient arrives from [facility] for surgical consultation.  Patient has an incarcerated periumbilical ventral hernia with partial small bowel obstruction, pain and vomiting starting yesterday.  Patient seen in hallway bed.  She appears comfortable.  Additional pain medication ordered at patient's request.  Placed page to general surgery.   BP (!) 153/102 (BP Location: Left Arm)   Pulse 69   Temp (!) 97.5 F (36.4 C) (Oral)   Resp 18   Ht 5\' 3"  (1.6 m)   Wt 68 kg   SpO2 98%   BMI 26.57 kg/m   5:42 PM I spoke with Dr. Georgette Dover who will see patient.   BP (!) 146/104   Pulse 73   Temp (!) 97.5 F (36.4 C) (Oral)   Resp 18   Ht 5\' 3"  (1.6 m)   Wt 68 kg   SpO2 99%   BMI 26.57 kg/m   6:59 PM Dr. Georgette Dover has seen. Hernia reduced. Pt will be given PO challenge. Plan d/c if doing well.   BP 133/87 (BP Location: Left Arm)   Pulse 61   Temp (!) 97.5 F (36.4 C) (Oral)   Resp 16   Ht 5\' 3"  (1.6 m)   Wt 68 kg   SpO2 99%   BMI 26.57 kg/m    7:17 PM Pt has tolerated clears, no nausea. She is comfortable with d/c to home.      Carlisle Cater, PA-C 08/29/19 1918    Lucrezia Starch, MD 08/29/19 [phone]

## 2019-08-29 NOTE — ED Triage Notes (Signed)
Pt arrives to ED with c/o abdominal pain and vomiting starting last night. Pt reports generalized abdominal pain, denies any pain in chest back or arms. Denies diarrhea or constipation. Pt reports one episode of vomiting in the last 24 hours.

## 2019-08-29 NOTE — Discharge Instructions (Signed)
Please read and follow all provided instructions.  Your diagnoses today include:  1. Incisional hernia with obstruction but no gangrene     Tests performed today include: Blood counts and electrolytes Blood tests to check liver and kidney function Blood tests to check pancreas function Urine test to look for infection CT scan - showed a hernia with blockage, also slightly larger pancreatic mass and right kidney cyst that is larger. You should have these areas checked by your cancer or primary doctors.  Vital signs. See below for your results today.   Medications prescribed:  None  Take any prescribed medications only as directed.  Home care instructions:  Follow any educational materials contained in this packet.  Follow-up instructions: Please follow-up with your surgeon or with Dr. Georgette Dover in regards to the hernia.  Please follow-up with your oncologist or primary care doctor in regards to the enlargement of the pancreas and kidney masses.  Return instructions:  SEEK IMMEDIATE MEDICAL ATTENTION IF: The pain does not go away or becomes severe  A temperature above 101F develops  Repeated vomiting occurs (multiple episodes)  The pain becomes localized to portions of the abdomen. The right side could possibly be appendicitis. In an adult, the left lower portion of the abdomen could be colitis or diverticulitis.  Blood is being passed in stools or vomit (bright red or black tarry stools)  You develop chest pain, difficulty breathing, dizziness or fainting, or become confused, poorly responsive, or inconsolable (young children) If you have any other emergent concerns regarding your health  Additional Information: Abdominal (belly) pain can be caused by many things. Your caregiver performed an examination and possibly ordered blood/urine tests and imaging (CT scan, x-rays, ultrasound). Many cases can be observed and treated at home after initial evaluation in the emergency department.  Even though you are being discharged home, abdominal pain can be unpredictable. Therefore, you need a repeated exam if your pain does not resolve, returns, or worsens. Most patients with abdominal pain don't have to be admitted to the hospital or have surgery, but serious problems like appendicitis and gallbladder attacks can start out as nonspecific pain. Many abdominal conditions cannot be diagnosed in one visit, so follow-up evaluations are very important.  Your vital signs today were: BP 133/87 (BP Location: Left Arm)    Pulse 61    Temp (!) 97.5 F (36.4 C) (Oral)    Resp 16    Ht 5\' 3"  (1.6 m)    Wt 68 kg    SpO2 99%    BMI 26.57 kg/m  If your blood pressure (bp) was elevated above 135/85 this visit, please have this repeated by your doctor within one month. --------------

## 2020-02-13 ENCOUNTER — Emergency Department (HOSPITAL_BASED_OUTPATIENT_CLINIC_OR_DEPARTMENT_OTHER): Payer: Medicare Other

## 2020-02-13 ENCOUNTER — Other Ambulatory Visit: Payer: Self-pay

## 2020-02-13 ENCOUNTER — Encounter (HOSPITAL_BASED_OUTPATIENT_CLINIC_OR_DEPARTMENT_OTHER): Payer: Self-pay | Admitting: Emergency Medicine

## 2020-02-13 ENCOUNTER — Emergency Department (HOSPITAL_BASED_OUTPATIENT_CLINIC_OR_DEPARTMENT_OTHER)
Admission: EM | Admit: 2020-02-13 | Discharge: 2020-02-13 | Disposition: A | Discharge status: Home / Self Care | Payer: Medicare Other | Attending: Emergency Medicine | Admitting: Emergency Medicine

## 2020-02-13 DIAGNOSIS — T465X5A Adverse effect of other antihypertensive drugs, initial encounter: Secondary | ICD-10-CM | POA: Insufficient documentation

## 2020-02-13 DIAGNOSIS — Z85038 Personal history of other malignant neoplasm of large intestine: Secondary | ICD-10-CM | POA: Insufficient documentation

## 2020-02-13 DIAGNOSIS — I10 Essential (primary) hypertension: Secondary | ICD-10-CM | POA: Diagnosis not present

## 2020-02-13 DIAGNOSIS — T447X5A Adverse effect of beta-adrenoreceptor antagonists, initial encounter: Secondary | ICD-10-CM | POA: Diagnosis not present

## 2020-02-13 DIAGNOSIS — Z79899 Other long term (current) drug therapy: Secondary | ICD-10-CM | POA: Diagnosis not present

## 2020-02-13 DIAGNOSIS — R008 Other abnormalities of heart beat: Secondary | ICD-10-CM

## 2020-02-13 DIAGNOSIS — E119 Type 2 diabetes mellitus without complications: Secondary | ICD-10-CM | POA: Insufficient documentation

## 2020-02-13 DIAGNOSIS — Z8616 Personal history of COVID-19: Secondary | ICD-10-CM | POA: Diagnosis not present

## 2020-02-13 DIAGNOSIS — Z7982 Long term (current) use of aspirin: Secondary | ICD-10-CM | POA: Diagnosis not present

## 2020-02-13 DIAGNOSIS — T50905A Adverse effect of unspecified drugs, medicaments and biological substances, initial encounter: Secondary | ICD-10-CM

## 2020-02-13 DIAGNOSIS — R002 Palpitations: Secondary | ICD-10-CM | POA: Diagnosis present

## 2020-02-13 DIAGNOSIS — I498 Other specified cardiac arrhythmias: Secondary | ICD-10-CM | POA: Insufficient documentation

## 2020-02-13 LAB — CBC WITH DIFFERENTIAL/PLATELET
Abs Immature Granulocytes: 0.02 10*3/uL (ref 0.00–0.07)
Basophils Absolute: 0.1 10*3/uL (ref 0.0–0.1)
Basophils Relative: 1 %
Eosinophils Absolute: 0.2 10*3/uL (ref 0.0–0.5)
Eosinophils Relative: 3 %
HCT: 47.2 % — ABNORMAL HIGH (ref 36.0–46.0)
Hemoglobin: 15.7 g/dL — ABNORMAL HIGH (ref 12.0–15.0)
Immature Granulocytes: 0 %
Lymphocytes Relative: 32 %
Lymphs Abs: 2.2 10*3/uL (ref 0.7–4.0)
MCH: 28.6 pg (ref 26.0–34.0)
MCHC: 33.3 g/dL (ref 30.0–36.0)
MCV: 86 fL (ref 80.0–100.0)
Monocytes Absolute: 0.8 10*3/uL (ref 0.1–1.0)
Monocytes Relative: 11 %
Neutro Abs: 3.6 10*3/uL (ref 1.7–7.7)
Neutrophils Relative %: 53 %
Platelets: 290 10*3/uL (ref 150–400)
RBC: 5.49 MIL/uL — ABNORMAL HIGH (ref 3.87–5.11)
RDW: 14.9 % (ref 11.5–15.5)
WBC: 6.9 10*3/uL (ref 4.0–10.5)
nRBC: 0 % (ref 0.0–0.2)

## 2020-02-13 LAB — COMPREHENSIVE METABOLIC PANEL
ALT: 23 U/L (ref 0–44)
AST: 16 U/L (ref 15–41)
Albumin: 3.7 g/dL (ref 3.5–5.0)
Alkaline Phosphatase: 74 U/L (ref 38–126)
Anion gap: 10 (ref 5–15)
BUN: 20 mg/dL (ref 8–23)
CO2: 21 mmol/L — ABNORMAL LOW (ref 22–32)
Calcium: 10.6 mg/dL — ABNORMAL HIGH (ref 8.9–10.3)
Chloride: 104 mmol/L (ref 98–111)
Creatinine, Ser: 0.93 mg/dL (ref 0.44–1.00)
GFR, Estimated: >60 mL/min (ref >60)
Glucose, Bld: 92 mg/dL (ref 70–99)
Potassium: 4 mmol/L (ref 3.5–5.1)
Sodium: 135 mmol/L (ref 135–145)
Total Bilirubin: 0.3 mg/dL (ref 0.3–1.2)
Total Protein: 8 g/dL (ref 6.5–8.1)

## 2020-02-13 LAB — MAGNESIUM: Magnesium: 2 mg/dL (ref 1.7–2.4)

## 2020-02-13 NOTE — ED Triage Notes (Signed)
Pt reports her "heart is fluttering" for several days. Denies pain. Reports her PCP recently changed her BP meds. She also recently had a steroid shot but is unsure why.

## 2020-02-13 NOTE — Discharge Instructions (Signed)
All your lab work today is normal.  You were initially having irregular beats but now they are gone.  You should continue your metoprolol but doe not take the other blood pressure medication.  You may need another medication for your blood pressure but due to your adverse reaction this is not a good option for you.

## 2020-02-13 NOTE — ED Provider Notes (Signed)
[facility] EMERGENCY DEPARTMENT Provider Note   CSN: [id number] Arrival date & time: 02/13/20  1118     History Chief Complaint  Patient presents with  . Palpitations    Miranda [name] is a 75 y.o. female.  Patient is a 75 year old female with a history of hypertension, hyperlipidemia, GERD, hyperparathyroidism was presenting today with complaint of palpitations and not feeling well.  Patient reports she was normal when she woke up this morning and then around 8:00 she took her morning blood pressure medications including metoprolol and Hyzaar( which she took for the first time today) and reported approximately 30 minutes later she developed symptoms of her heart racing, feeling lightheaded and not feeling well at all.  She had to go lay down and reports she is still not feeling better.  Some of the symptoms have improved but she is not back to her baseline.  When this happened she also felt short of breath.  She denied any chest pain or discomfort.  She did not have any syncope but again did feel dizzy.  She started the metoprolol in December due to an a reaction to a different blood pressure medication.  Since that time her blood pressure has been running high and that is why she was given this additional medication.  She has a known mass on her kidney and is planning on surgery next month but denies any acute issues with that.  She is urinating, eating and having bowel movements like normal.  The history is provided by the patient.  Palpitations      Past Medical History:  Diagnosis Date  . Adjustment disorder with depressed mood 06/16/2012  . Bronchitis   . Diabetes mellitus ([place])   . Essential hypertension 11/24/2016  . GERD (gastroesophageal reflux disease) 09/17/2013  . Hyperlipidemia 04/06/2013  . 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 Incarcerated incisional hernia 08/29/2019  . SBO (small bowel  obstruction) ([place]) 08/29/2019  . DM (diabetes mellitus) ([place]) 08/29/2019  . Hypercalcemia 04/02/2019  . Hyperparathyroidism ([place]) 04/02/2019  . Vitamin D deficiency 04/02/2019  . COVID-19 virus infection 12/10/2018  . Myopia with presbyopia, bilateral 01/12/2017  . Posterior vitreous detachment of both eyes 01/12/2017  . Essential hypertension 11/24/2016  . 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 Dry eye syndrome of both lacrimal glands 06/21/2016  . Epiretinal membrane (ERM) of left eye 06/21/2016  . Nuclear sclerotic cataract of both eyes 06/21/2016  . GERD (gastroesophageal reflux disease) 09/17/2013  . Cystocele 09/12/2013  . Rectocele 09/12/2013  . Allergic rhinitis 04/06/2013  . Hyperlipidemia 04/06/2013  . Mastodynia 07/24/2012  . Breast mass 06/28/2012  . Adjustment disorder with depressed mood 06/16/2012  . External hemorrhoids 06/16/2012  . Ganglion of tendon sheath 06/16/2012    Past Surgical History:  Procedure Laterality Date  . ABDOMINAL HYSTERECTOMY    . BREAST BIOPSY Right 2007  . LAPAROSCOPIC LEFT COLON RESECTION  2018   [facility]all.    . TONSILECTOMY/ADENOIDECTOMY WITH MYRINGOTOMY    . VESICOVAGINAL FISTULA CLOSURE       OB History   No obstetric history on file.     No family history on file.  Social History   Tobacco Use  . Smoking status: Never Smoker  . Smokeless tobacco: Never Used  Substance Use Topics  . Alcohol use: No  . Drug use: No    Home  Medications Prior to Admission medications   Medication Sig Start Date End Date Taking? Authorizing Provider  metoprolol succinate (TOPROL-XL) 25 MG 24 hr tablet Take 25 mg by mouth daily.   Yes [provider]  albuterol (PROVENTIL HFA;VENTOLIN HFA) 108 (90 Base) MCG/ACT inhaler Inhale 2 puffs into the lungs every 4 (four) hours as needed for wheezing or shortness of breath.    [provider]  amLODipine (NORVASC) 10 MG tablet  Take 10 mg by mouth daily.    [provider]  aspirin EC 81 MG tablet Take 81 mg by mouth daily.    [provider]  benzonatate (TESSALON) 100 MG capsule Take 1 capsule (100 mg total) by mouth every 8 (eight) hours. 05/29/16   Long, Wonda Olds, MD  fluticasone (FLONASE) 50 MCG/ACT nasal spray Place 2 sprays into both nostrils daily. 05/29/16   Long, Wonda Olds, MD    Allergies    Sulfa antibiotics  Review of Systems   Review of Systems  Cardiovascular: Positive for palpitations.  All other systems reviewed and are negative.   Physical Exam Updated Vital Signs BP (!) 161/111 (BP Location: Left Arm)   Pulse (!) 56   Temp 98.2 F (36.8 C) (Oral)   Resp 20   Ht 5\' 3"  (1.6 m)   Wt 70.8 kg   SpO2 100%   BMI 27.63 kg/m   Physical Exam Vitals and nursing note reviewed.  Constitutional:      General: She is not in acute distress.    Appearance: Normal appearance. She is well-developed, normal weight and well-nourished.  HENT:     Head: Normocephalic and atraumatic.  Eyes:     Extraocular Movements: EOM normal.     Pupils: Pupils are equal, round, and reactive to light.  Cardiovascular:     Rate and Rhythm: Normal rate and regular rhythm. Frequent extrasystoles are present.    Pulses: Intact distal pulses.     Heart sounds: Normal heart sounds. No murmur heard. No friction rub.  Pulmonary:     Effort: Pulmonary effort is normal.     Breath sounds: Normal breath sounds. No wheezing or rales.  Abdominal:     General: Bowel sounds are normal. There is no distension.     Palpations: Abdomen is soft.     Tenderness: There is no abdominal tenderness. There is no guarding or rebound.  Musculoskeletal:        General: No tenderness. Normal range of motion.     Right lower leg: No edema.     Left lower leg: No edema.     Comments: No edema  Skin:    General: Skin is warm and dry.     Findings: No rash.  Neurological:     General: No focal deficit present.      Mental Status: She is alert and oriented to person, place, and time. Mental status is at baseline.     Cranial Nerves: No cranial nerve deficit.  Psychiatric:        Mood and Affect: Mood and affect and mood normal.        Behavior: Behavior normal.        Thought Content: Thought content normal.      ED Results / Procedures / Treatments   Labs (all labs ordered are listed, but only abnormal results are displayed) Labs Reviewed  CBC WITH DIFFERENTIAL/PLATELET - Abnormal; Notable for the following components:      Result Value   RBC 5.49 (*)  Hemoglobin 15.7 (*)    HCT 47.2 (*)    All other components within normal limits  COMPREHENSIVE METABOLIC PANEL - Abnormal; Notable for the following components:   CO2 21 (*)    Calcium 10.6 (*)    All other components within normal limits  MAGNESIUM    EKG EKG Interpretation  Date/Time:  Thursday February 13 2020 11:29:10 EST Ventricular Rate:  111 PR Interval:    QRS Duration: 83 QT Interval:  358 QTC Calculation: 324 R Axis:   -13 Text Interpretation: Sinus tachycardia new Ventricular bigeminy Anteroseptal infarct, old Nonspecific T abnormalities, lateral leads Minimal ST elevation, inferior leads Confirmed by Blanchie Dessert [id number]) on 02/13/2020 12:04:08 PM   Radiology DG Chest Port 1 View  Result Date: 02/13/2020 CLINICAL DATA:  Palpitations EXAM: PORTABLE CHEST 1 VIEW COMPARISON:  Chest radiograph May 29, 2016 FINDINGS: Enlarged cardiac silhouette. No focal consolidation or overt pulmonary edema. No pleural effusion. No pneumothorax. The visualized skeletal structures are unremarkable. IMPRESSION: Enlarged cardiac silhouette without overt pulmonary edema. Electronically Signed   By: Dahlia Bailiff MD   On: 02/13/2020 12:41    Procedures Procedures   Medications Ordered in ED Medications - No data to display  ED Course  I have reviewed the triage vital signs and the nursing notes.  Pertinent labs & imaging results  that were available during my care of the patient were reviewed by me and considered in my medical decision making (see chart for details).    MDM Rules/Calculators/A&P                          Patient presenting this morning after having palpitations after taking Hyzaar for the first time with her metoprolol.  Upon arrival here patient is currently in bigeminy.  Blood pressure remains elevated and oxygen saturation within normal limits.  There is no other focal exam findings at this time.  Patient's CBC, CMPAnd magnesium are within normal limits.  Chest x-ray does show an enlarged cardiac silhouette without overt pulmonary edema.  Patient monitored for approximately 2 hours.  Blood pressure did not change significantly however the bigeminy resolved and on final evaluation patient was in a normal sinus rhythm without any ectopy.  Concerned that patient symptoms today were related to the Hyzaar and that this was an adverse reaction.  She has been on the metoprolol now for approximately 6 weeks and recommended that she continue that medication but that she should talk with her doctor for possible medication change given the symptoms that occurred after taking this medication today.  Low suspicion for MI and no other evidence for dysrhythmia other than bigeminy.  Low suspicion for PE or other acute process at this time.  Feel that patient is stable for discharge.  She was able to ambulate in the department without issue and she was discharged home.  MDM Number of Diagnoses or Management Options   Amount and/or Complexity of Data Reviewed Clinical lab tests: ordered and reviewed Tests in the radiology section of CPT: ordered and reviewed Tests in the medicine section of CPT: ordered and reviewed Decide to obtain previous medical records or to obtain history from someone other than the patient: yes Obtain history from someone other than the patient: yes Review and summarize past medical records:  yes Discuss the patient with other providers: yes Independent visualization of images, tracings, or specimens: yes  Risk of Complications, Morbidity, and/or Mortality Presenting problems: moderate Diagnostic procedures: low Management  options: low  Patient Progress Patient progress: improved  Final Clinical Impression(s) / ED Diagnoses Final diagnoses:  Ventricular bigeminy seen on cardiac monitor  Palpitations  Medication reaction, initial encounter    Rx / DC Orders ED Discharge Orders    None       Blanchie Dessert, MD 02/13/20 1456

## 2020-02-14 ENCOUNTER — Telehealth (HOSPITAL_BASED_OUTPATIENT_CLINIC_OR_DEPARTMENT_OTHER): Payer: Self-pay | Admitting: *Deleted

## 2021-10-16 IMAGING — CT CT ABD-PELV W/ CM
2 of 5 series · 16 of 46 positions shown, 18 images · IV contrast (omnipaque)
Comparison: Outside CT 11/24/2016.

CLINICAL DATA: Nausea and vomiting. Abdominal pain and bloating.
Symptoms began last night.

EXAM:
CT ABDOMEN AND PELVIS WITH CONTRAST
TECHNIQUE: Multidetector CT imaging of the abdomen and pelvis was performed
using the standard protocol following bolus administration of
intravenous contrast.
CONTRAST:  100mL OMNIPAQUE IOHEXOL 300 MG/ML  SOLN

[Series 2: axial st · axial · 0.88mm/px · z∈[-467,-37]mm · 13 of 98 slices shown, 15 images]
[im 6/98  soft-tissue]
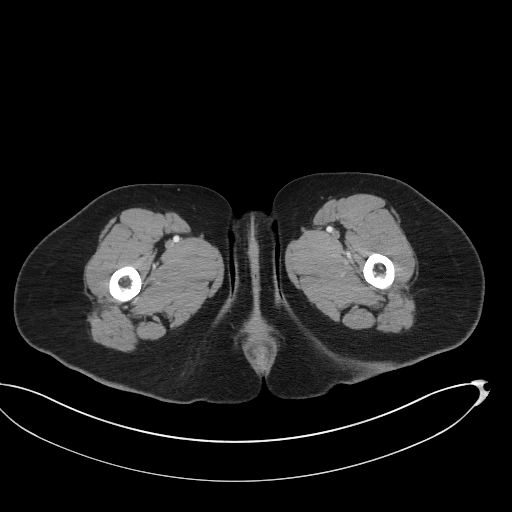
[im 6/98  bone]
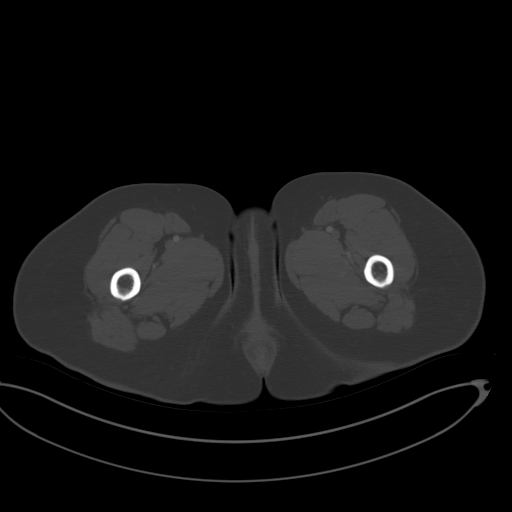
[im 11/98  soft-tissue]
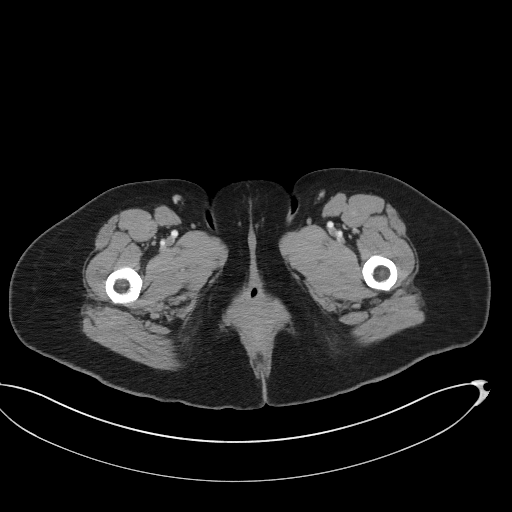
[im 22/98  soft-tissue]
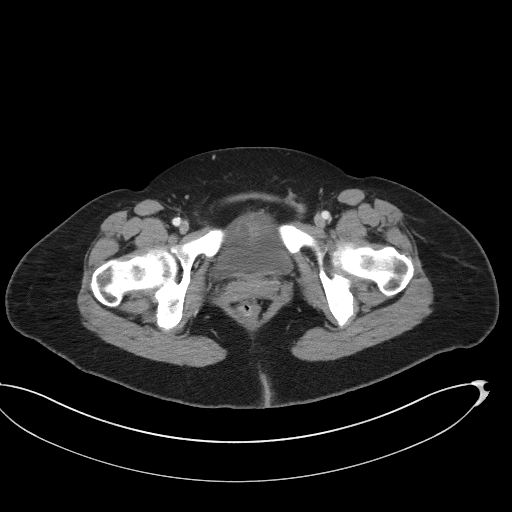
[im 27/98  soft-tissue]
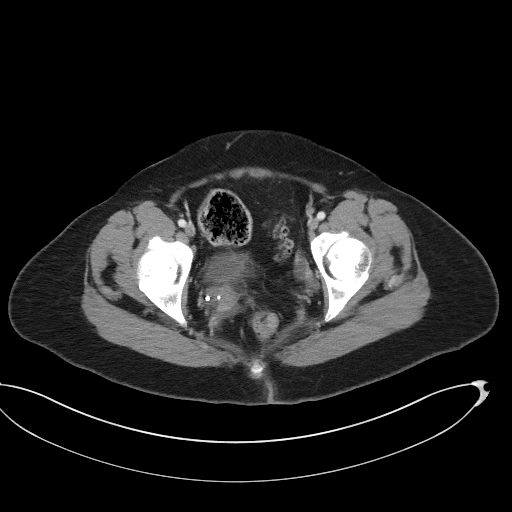
[im 33/98  soft-tissue]
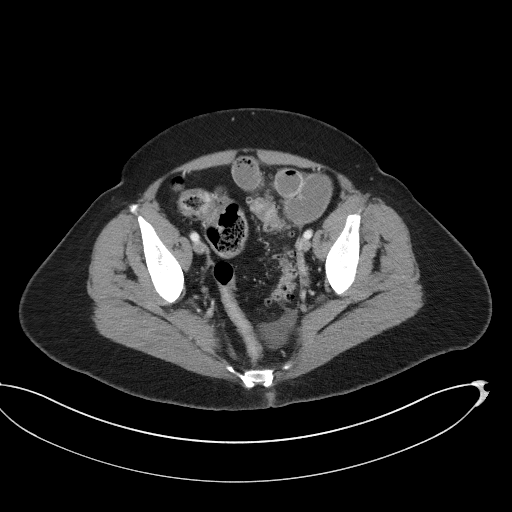
[im 44/98  soft-tissue]
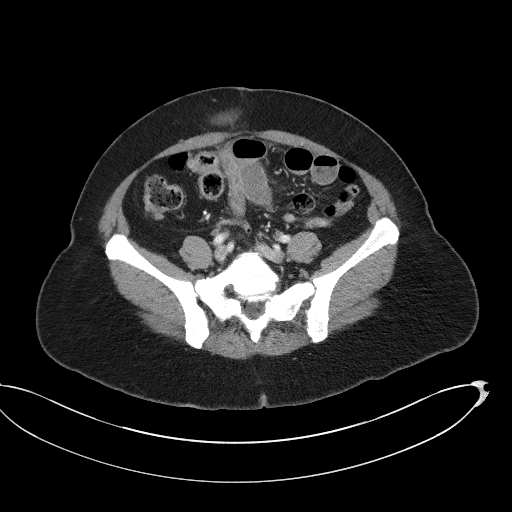
[im 49/98  soft-tissue]
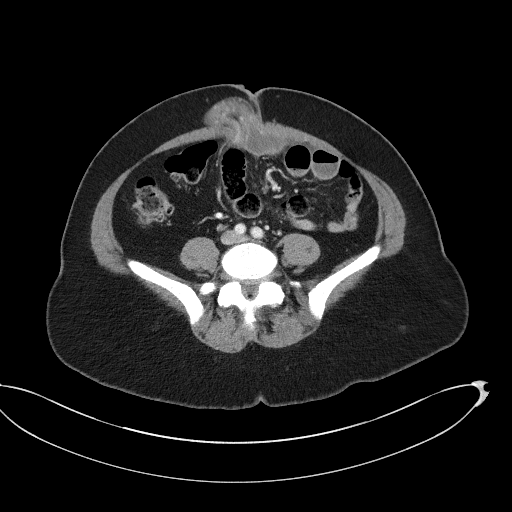
[im 54/98  soft-tissue]
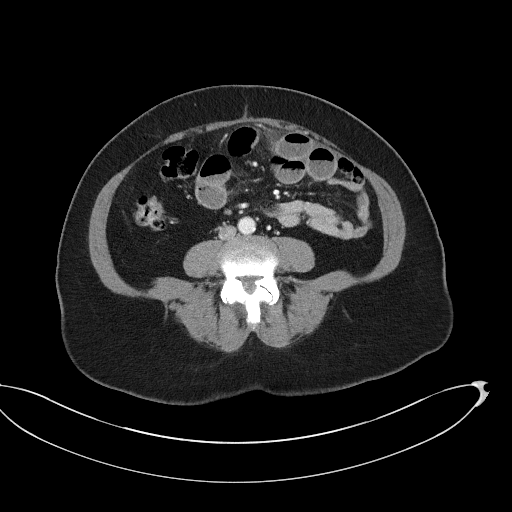
[im 65/98  soft-tissue]
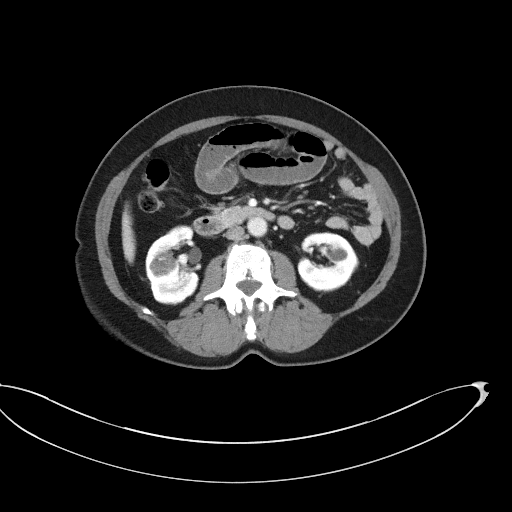
[im 65/98  bone]
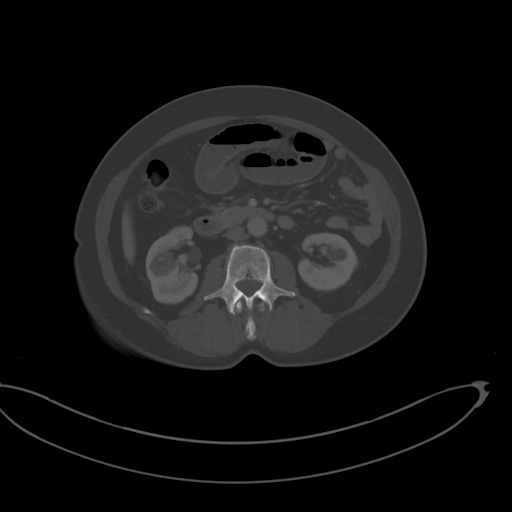
[im 71/98  soft-tissue]
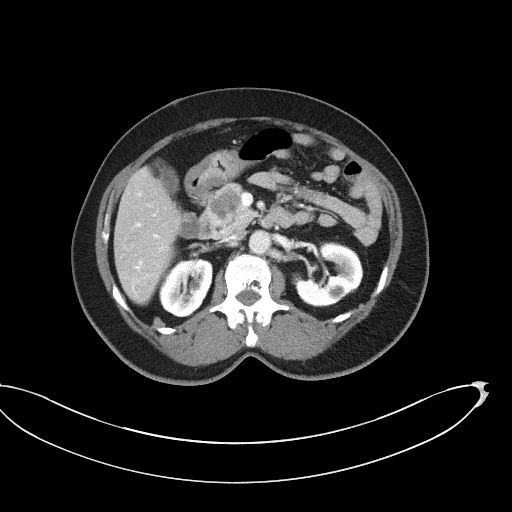
[im 76/98  soft-tissue]
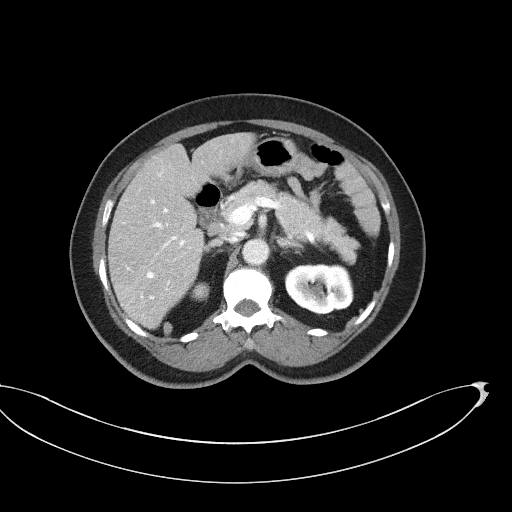
[im 87/98  soft-tissue]
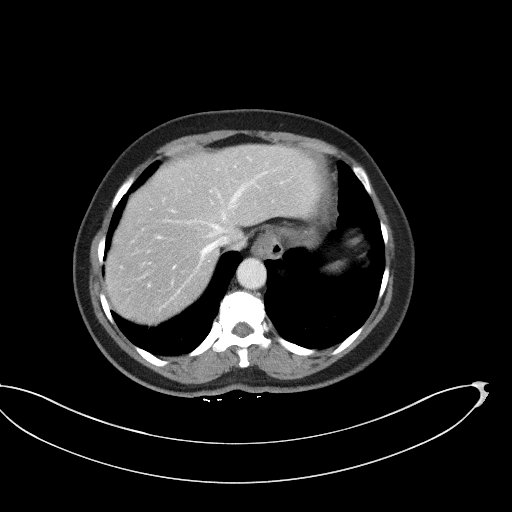
[im 92/98  soft-tissue]
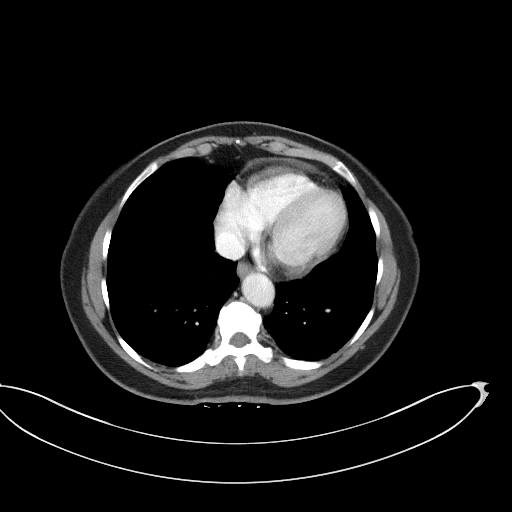

[Series 5: coronal st · coronal · 0.80mm/px · 3 of 96 slices shown]
[im 32/96  soft-tissue]
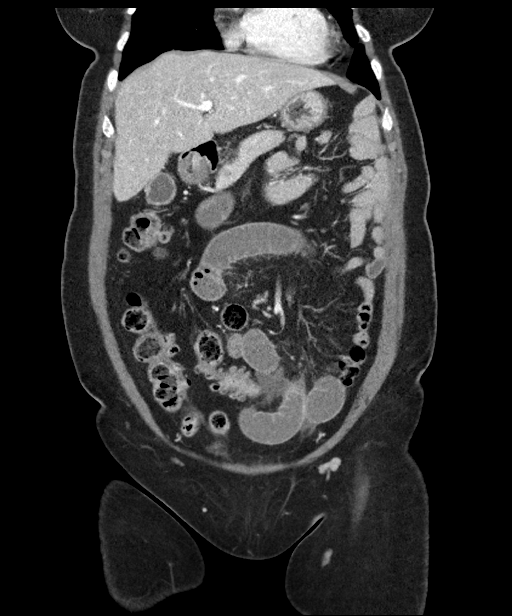
[im 43/96  soft-tissue]
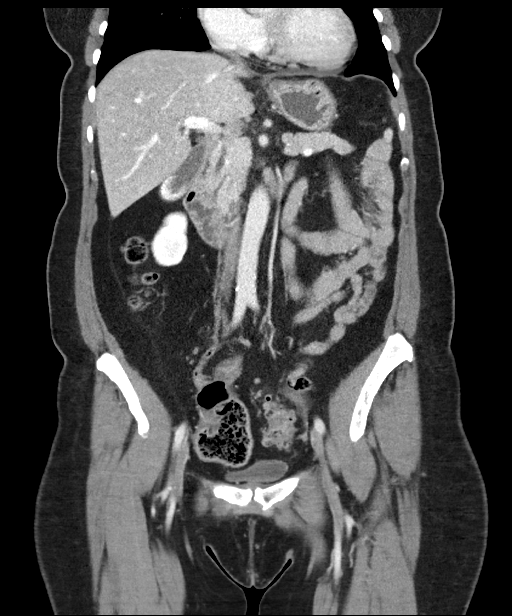
[im 53/96  soft-tissue]
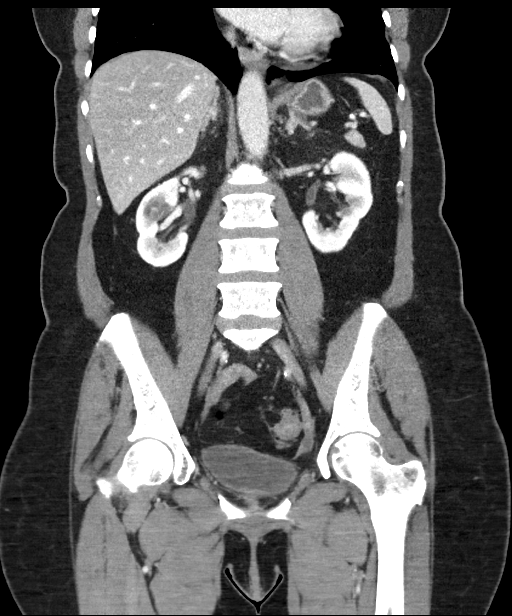

[16 of 46 positions shown; findings below may reference images not displayed]

FINDINGS: Lower chest: Mild patchy density at the lung bases right more than
left could be scarring or mild atelectasis. No pleural effusion. No
pericardial fluid.

Hepatobiliary: Mild fatty change of the liver. No focal lesion.
Gallstone in the gallbladder dependently measuring 2.9 cm in
diameter. No CT evidence of cholecystitis.

Pancreas: 2.8 cm low-density mass in the head of the pancreas.
Compared to a study of 8848 when this area measured about 2 cm in
size, in a person of this age this remains suggestive of lower grade
pancreatic tumor. No sign of acute pancreatitis.

Spleen: Normal

Adrenals/Urinary Tract: Adrenal glands are normal. Bilateral renal
cysts. In the midportion of the right kidney, the cyst has enlarged
in shows internal complexity, measuring 2.5 cm. No hydronephrosis.
Bladder is normal.

Stomach/Bowel: Newly seen ventral hernia, periumbilical, containing
a loop of small intestine with relative obstruction in this
location. No other significant bowel finding. Diverticulosis without
evidence of diverticulitis.

Vascular/Lymphatic: Aortic atherosclerosis, mild. No aneurysm. IVC
is normal. No retroperitoneal adenopathy.

Reproductive: Previous hysterectomy.  No pelvic mass.

Other: Small amount of free fluid in the pelvis.  No free air.

Musculoskeletal: Ordinary lower lumbar degenerative changes.
IMPRESSION: Periumbilical ventral hernia containing a loop of small intestine
with partial small bowel obstruction in this location.

Chololithiasis.  Mild fatty change the liver.

Mass at the head of the pancreas measuring up to 2.8 cm, enlarged
from measurement of 2 cm in 8848. This indicates a likely low-grade
pancreatic neoplasm. Because of interval enlargement, abdominal MRI
with and without contrast is recommended.

Enlargement of a complex cystic lesion of the midportion of the
right kidney. This could also be evaluated with MRI with and without
contrast.

## 2022-01-04 ENCOUNTER — Emergency Department (HOSPITAL_BASED_OUTPATIENT_CLINIC_OR_DEPARTMENT_OTHER)
Admission: EM | Admit: 2022-01-04 | Discharge: 2022-01-04 | Disposition: A | Discharge status: Home / Self Care | Payer: Medicare Other | Attending: Emergency Medicine | Admitting: Emergency Medicine

## 2022-01-04 ENCOUNTER — Encounter (HOSPITAL_BASED_OUTPATIENT_CLINIC_OR_DEPARTMENT_OTHER): Payer: Self-pay | Admitting: Emergency Medicine

## 2022-01-04 ENCOUNTER — Emergency Department (HOSPITAL_BASED_OUTPATIENT_CLINIC_OR_DEPARTMENT_OTHER): Payer: Medicare Other

## 2022-01-04 DIAGNOSIS — Z79899 Other long term (current) drug therapy: Secondary | ICD-10-CM | POA: Insufficient documentation

## 2022-01-04 DIAGNOSIS — R600 Localized edema: Secondary | ICD-10-CM | POA: Insufficient documentation

## 2022-01-04 DIAGNOSIS — E119 Type 2 diabetes mellitus without complications: Secondary | ICD-10-CM | POA: Diagnosis not present

## 2022-01-04 DIAGNOSIS — J069 Acute upper respiratory infection, unspecified: Secondary | ICD-10-CM

## 2022-01-04 DIAGNOSIS — Z7982 Long term (current) use of aspirin: Secondary | ICD-10-CM | POA: Insufficient documentation

## 2022-01-04 DIAGNOSIS — Z20822 Contact with and (suspected) exposure to covid-19: Secondary | ICD-10-CM | POA: Insufficient documentation

## 2022-01-04 DIAGNOSIS — R059 Cough, unspecified: Secondary | ICD-10-CM | POA: Diagnosis present

## 2022-01-04 DIAGNOSIS — I10 Essential (primary) hypertension: Secondary | ICD-10-CM | POA: Diagnosis not present

## 2022-01-04 LAB — RESP PANEL BY RT-PCR (RSV, FLU A&B, COVID)  RVPGX2
Influenza A by PCR: NEGATIVE
Influenza B by PCR: NEGATIVE
Resp Syncytial Virus by PCR: NEGATIVE
SARS Coronavirus 2 by RT PCR: NEGATIVE

## 2022-01-04 MED ORDER — DOXYCYCLINE HYCLATE 100 MG PO CAPS: 100.0000 mg | ORAL_CAPSULE | Freq: Two times a day (BID) | ORAL | 0 refills | Status: AC

## 2022-01-04 MED ORDER — ALBUTEROL SULFATE HFA 108 (90 BASE) MCG/ACT IN AERS: 1.0000 | INHALATION_SPRAY | Freq: Four times a day (QID) | RESPIRATORY_TRACT | 0 refills | Status: AC | PRN

## 2022-01-04 NOTE — Discharge Instructions (Signed)
You were seen for your cough in the emergency department.   At home, please take the doxycycline and albuterol in case you have bronchitis.    Follow-up with your primary doctor in 2-3 days regarding your visit.  If you do not have a primary doctor you may follow-up at our facility with [facility].  Return immediately to the emergency department if you experience any of the following: Difficulty breathing, chest pain, or any other concerning symptoms.    Thank you for visiting our Emergency Department. It was a pleasure taking care of you today.

## 2022-01-04 NOTE — ED Provider Notes (Signed)
[facility] EMERGENCY DEPARTMENT Provider Note   CSN: [id number] Arrival date & time: 01/04/22  3570     History  Chief Complaint  Patient presents with   Cough    Miranda Miranda [name] is a 76 y.o. female.  76 year old female with a history of hypertension, diabetes, and wheezing (previously on albuterol inhaler) who presents emergency department with cough.  Patient reports that for the past 6 days she has had a cough.  Says it is also preceded by sinus discomfort and congestion.  Says that she her cough has been productive of green sputum.  Only has had mild shortness of breath.  Denies any chest pain.  No leg swelling.  No history of DVT/PE.  Denies any history of COPD or smoking but says that she was previously given an inhaler for wheezing that she thinks may have been due to asthma but is unsure.       Home Medications Prior to Admission medications   Medication Sig Start Date End Date Taking? Authorizing Provider  albuterol (VENTOLIN HFA) 108 (90 Base) MCG/ACT inhaler Inhale 1-2 puffs into the lungs every 6 (six) hours as needed for wheezing or shortness of breath. 01/04/22  Yes Fransico Meadow, MD  doxycycline (VIBRAMYCIN) 100 MG capsule Take 1 capsule (100 mg total) by mouth 2 (two) times daily for 5 days. 01/04/22 01/09/22 Yes Fransico Meadow, MD  amLODipine (NORVASC) 10 MG tablet Take 10 mg by mouth daily.    [provider]  aspirin EC 81 MG tablet Take 81 mg by mouth daily.    [provider]  benzonatate (TESSALON) 100 MG capsule Take 1 capsule (100 mg total) by mouth every 8 (eight) hours. 05/29/16   Long, Wonda Olds, MD  fluticasone (FLONASE) 50 MCG/ACT nasal spray Place 2 sprays into both nostrils daily. 05/29/16   Long, Wonda Olds, MD  metoprolol succinate (TOPROL-XL) 25 MG 24 hr tablet Take 25 mg by mouth daily.    [provider]      Allergies    Sulfa antibiotics    Review of Systems   Review of Systems  Physical  Exam Updated Vital Signs BP (!) 178/94 (BP Location: Right Arm)   Pulse 73   Temp 98.2 F (36.8 C) (Oral)   Resp 20   Ht '5\' 3"'$  (1.6 m)   Wt 70.3 kg   SpO2 100%   BMI 27.46 kg/m  Physical Exam Vitals and nursing note reviewed.  Constitutional:      General: She is not in acute distress.    Appearance: She is well-developed.  HENT:     Miranda [name]: Normocephalic and atraumatic.     Right Ear: External ear normal.     Left Ear: External ear normal.     Nose: Nose normal.  Eyes:     Extraocular Movements: Extraocular movements intact.     Conjunctiva/sclera: Conjunctivae normal.     Pupils: Pupils are equal, round, and reactive to light.  Cardiovascular:     Rate and Rhythm: Normal rate and regular rhythm.     Heart sounds: No murmur heard. Pulmonary:     Effort: Pulmonary effort is normal. No respiratory distress.     Breath sounds: Wheezing (Faint expiratory) present.     Comments: Speaking in full sentences Abdominal:     General: Abdomen is flat.     Palpations: Abdomen is soft.  Musculoskeletal:     Cervical back: Normal range of motion and neck supple.  Right lower leg: Edema (Trace) present.     Left lower leg: Edema (Trace) present.  Skin:    General: Skin is warm and dry.  Neurological:     Mental Status: She is alert and oriented to person, place, and time. Mental status is at baseline.  Psychiatric:        Mood and Affect: Mood normal.     ED Results / Procedures / Treatments   Labs (all labs ordered are listed, but only abnormal results are displayed) Labs Reviewed  RESP PANEL BY RT-PCR (RSV, FLU A&B, COVID)  [id number]    EKG None  Radiology No results found.  Procedures Procedures   Medications Ordered in ED Medications - No data to display  ED Course/ Medical Decision Making/ A&P                           Medical Decision Making Amount and/or Complexity of Data Reviewed Radiology: ordered.  Risk Prescription drug management.   Miranda Miranda [name] is a 76 y.o. female with comorbidities that complicate the patient evaluation including hypertension, diabetes, and wheezing (previously on albuterol inhaler) who presents emergency department with cough.    Initial Ddx:  URI, pneumonia, COPD/asthma exacerbation  MDM:  Initially concern the patient may have a URI given her symptoms.  Also on the differential would be pneumonia so obtain chest x-ray.  With her wheezing concerned about reactive airway disease but does not have a diagnosed history of COPD or asthma.  Plan:  RVP CXR  ED Summary/Re-evaluation:  Above workup did not reveal source of infection.  Patient was offered albuterol, prednisone, doxycycline for her symptoms which are likely due to her reactive airway disease exacerbation.  Patient declined prednisone due to prior adverse reaction and was given the albuterol doxycycline.  Also instructed to follow-up with her primary doctor in several days regarding her symptoms.  This patient presents to the ED for concern of complaints listed in HPI, this involves an extensive number of treatment options, and is a complaint that carries with it a high risk of complications and morbidity. Disposition including potential need for admission considered.   Dispo: DC Home. Return precautions discussed including, but not limited to, those listed in the AVS. Allowed pt time to ask questions which were answered fully prior to dc.  Records reviewed Outpatient Clinic Notes I independently reviewed the following imaging with scope of interpretation limited to determining acute life threatening conditions related to emergency care: Chest x-ray and agree with the radiologist interpretation with the following exceptions: None I personally reviewed and interpreted cardiac monitoring: normal sinus rhythm  I have reviewed the patients home medications and made adjustments as needed Social Determinants of health:  Elderly  Final Clinical  Impression(s) / ED Diagnoses Final diagnoses:  Viral URI with cough    Rx / DC Orders ED Discharge Orders          Ordered    albuterol (VENTOLIN HFA) 108 (90 Base) MCG/ACT inhaler  Every 6 hours PRN        01/04/22 1236    doxycycline (VIBRAMYCIN) 100 MG capsule  2 times daily        01/04/22 1236              Fransico Meadow, MD 01/08/22 [phone]

## 2022-01-04 NOTE — ED Triage Notes (Signed)
Patient presents C/O cough, sore throat, congestion. Denies sick contacts.

## 2022-04-02 IMAGING — DX DG CHEST 1V PORT
1 series · 1 of 1 positions shown · non-contrast
Comparison: Chest radiograph May 29, 2016

CLINICAL DATA: Palpitations

EXAM:
PORTABLE CHEST 1 VIEW

[chest ap]
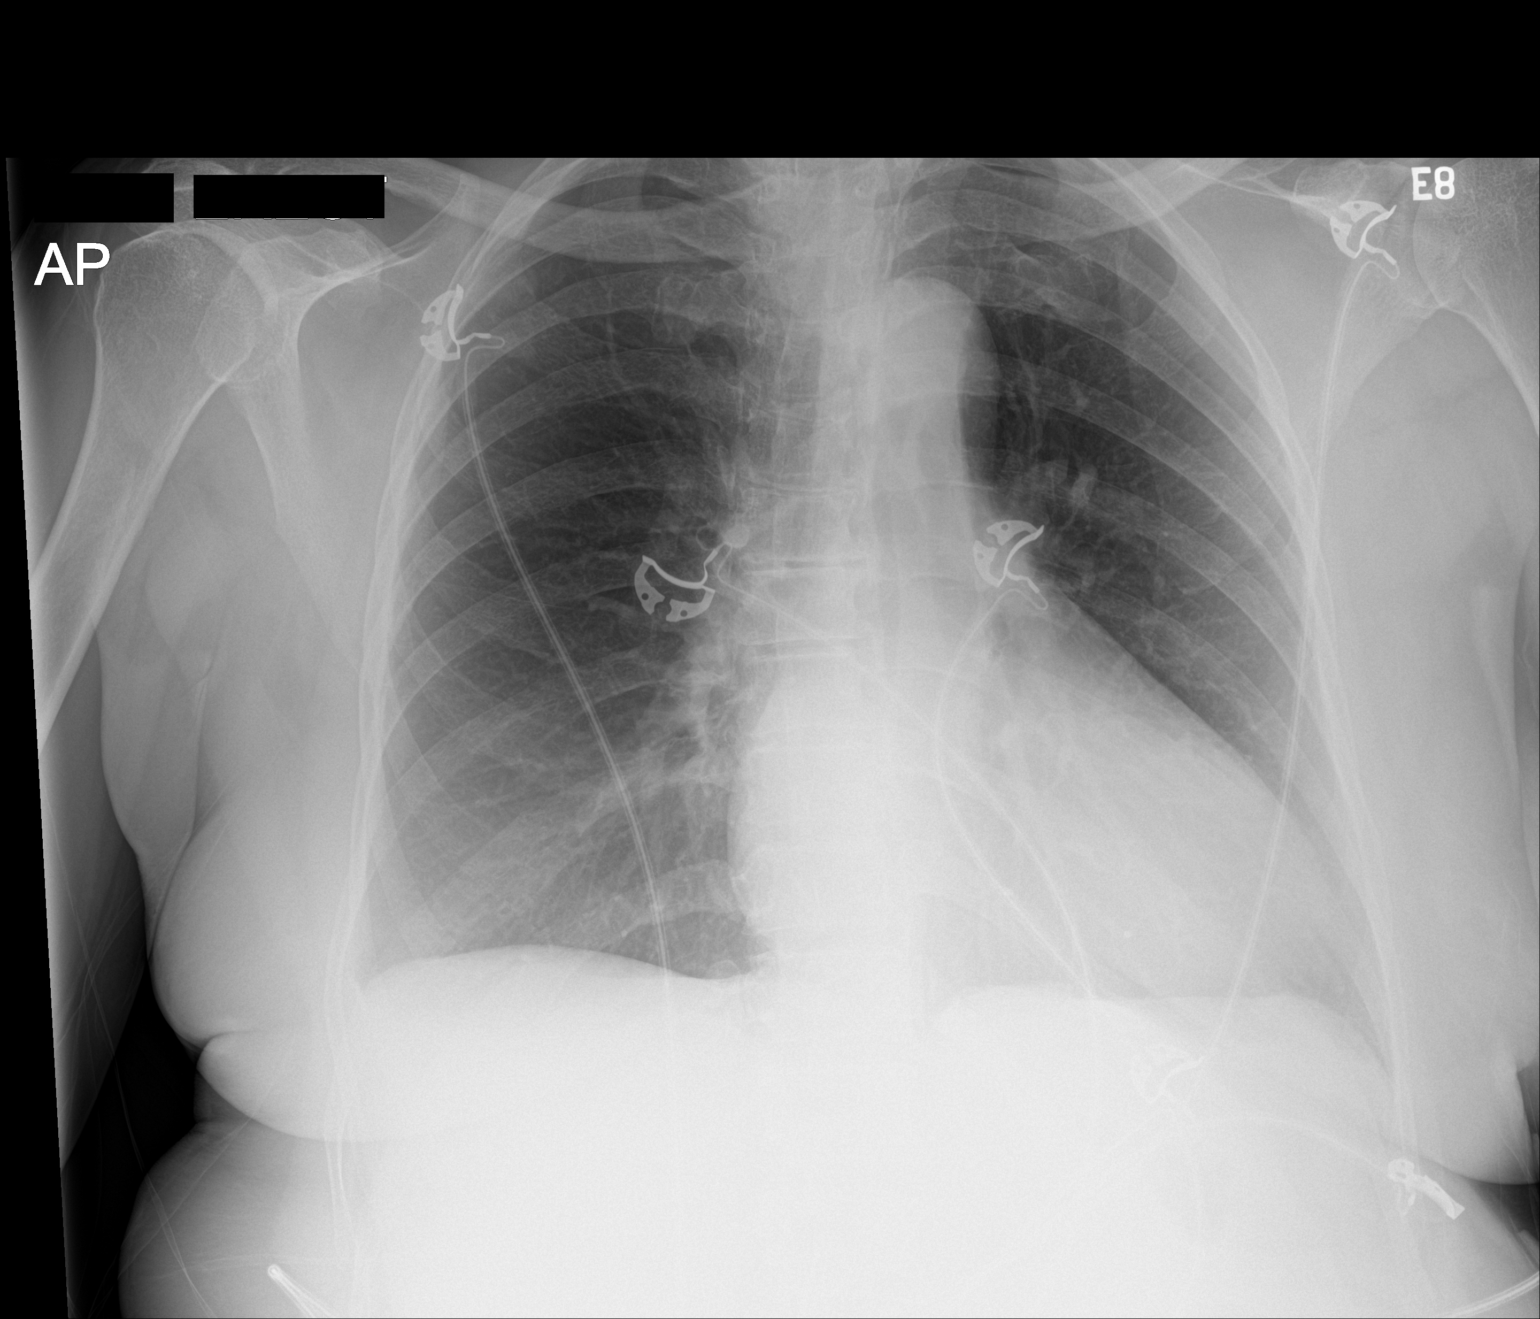

[1 of 1 positions shown; findings below may reference images not displayed]

FINDINGS: Enlarged cardiac silhouette. No focal consolidation or overt
pulmonary edema. No pleural effusion. No pneumothorax. The
visualized skeletal structures are unremarkable.
IMPRESSION: Enlarged cardiac silhouette without overt pulmonary edema.
